# Patient Record
Sex: Male | Born: 1950 | Race: Black or African American | Hispanic: No | Marital: Married | Smoking: Never smoker
Health system: Southern US, Community
[De-identification: ages and names within clinical notes are randomized; demographics above are authoritative.]

## PROBLEM LIST (undated history)

## (undated) DIAGNOSIS — M79643 Pain in unspecified hand: Secondary | ICD-10-CM

## (undated) DIAGNOSIS — E785 Hyperlipidemia, unspecified: Secondary | ICD-10-CM

## (undated) DIAGNOSIS — R001 Bradycardia, unspecified: Secondary | ICD-10-CM

## (undated) DIAGNOSIS — J189 Pneumonia, unspecified organism: Secondary | ICD-10-CM

## (undated) DIAGNOSIS — E7800 Pure hypercholesterolemia, unspecified: Secondary | ICD-10-CM

## (undated) HISTORY — DX: Pain in unspecified hand: M79.643

## (undated) HISTORY — DX: Pure hypercholesterolemia, unspecified: E78.00

## (undated) HISTORY — PX: TONSILLECTOMY: SUR1361

## (undated) HISTORY — DX: Pneumonia, unspecified organism: J18.9

---

## 2008-01-12 ENCOUNTER — Emergency Department (HOSPITAL_COMMUNITY): Admission: EM | Admit: 2008-01-12 | Discharge: 2008-01-13 | Payer: Self-pay | Admitting: Emergency Medicine

## 2013-11-30 DIAGNOSIS — E7800 Pure hypercholesterolemia, unspecified: Secondary | ICD-10-CM | POA: Diagnosis not present

## 2013-11-30 DIAGNOSIS — Z1331 Encounter for screening for depression: Secondary | ICD-10-CM | POA: Diagnosis not present

## 2013-11-30 DIAGNOSIS — Z23 Encounter for immunization: Secondary | ICD-10-CM | POA: Diagnosis not present

## 2013-11-30 DIAGNOSIS — R7301 Impaired fasting glucose: Secondary | ICD-10-CM | POA: Diagnosis not present

## 2014-05-30 DIAGNOSIS — H524 Presbyopia: Secondary | ICD-10-CM | POA: Diagnosis not present

## 2014-05-30 DIAGNOSIS — H0259 Other disorders affecting eyelid function: Secondary | ICD-10-CM | POA: Diagnosis not present

## 2014-05-30 DIAGNOSIS — H52223 Regular astigmatism, bilateral: Secondary | ICD-10-CM | POA: Diagnosis not present

## 2014-05-30 DIAGNOSIS — Z8669 Personal history of other diseases of the nervous system and sense organs: Secondary | ICD-10-CM | POA: Diagnosis not present

## 2014-05-30 DIAGNOSIS — H25042 Posterior subcapsular polar age-related cataract, left eye: Secondary | ICD-10-CM | POA: Diagnosis not present

## 2014-05-30 DIAGNOSIS — H2513 Age-related nuclear cataract, bilateral: Secondary | ICD-10-CM | POA: Diagnosis not present

## 2014-05-30 DIAGNOSIS — H40023 Open angle with borderline findings, high risk, bilateral: Secondary | ICD-10-CM | POA: Diagnosis not present

## 2014-05-30 DIAGNOSIS — H25013 Cortical age-related cataract, bilateral: Secondary | ICD-10-CM | POA: Diagnosis not present

## 2014-05-30 DIAGNOSIS — H5203 Hypermetropia, bilateral: Secondary | ICD-10-CM | POA: Diagnosis not present

## 2014-05-30 DIAGNOSIS — H16223 Keratoconjunctivitis sicca, not specified as Sjogren's, bilateral: Secondary | ICD-10-CM | POA: Diagnosis not present

## 2014-06-28 DIAGNOSIS — H16223 Keratoconjunctivitis sicca, not specified as Sjogren's, bilateral: Secondary | ICD-10-CM | POA: Diagnosis not present

## 2014-06-28 DIAGNOSIS — H40023 Open angle with borderline findings, high risk, bilateral: Secondary | ICD-10-CM | POA: Diagnosis not present

## 2015-01-08 DIAGNOSIS — R7309 Other abnormal glucose: Secondary | ICD-10-CM | POA: Diagnosis not present

## 2015-01-08 DIAGNOSIS — L299 Pruritus, unspecified: Secondary | ICD-10-CM | POA: Diagnosis not present

## 2015-01-08 DIAGNOSIS — Z1389 Encounter for screening for other disorder: Secondary | ICD-10-CM | POA: Diagnosis not present

## 2015-01-08 DIAGNOSIS — Z23 Encounter for immunization: Secondary | ICD-10-CM | POA: Diagnosis not present

## 2015-01-08 DIAGNOSIS — E780 Pure hypercholesterolemia: Secondary | ICD-10-CM | POA: Diagnosis not present

## 2015-01-09 DIAGNOSIS — H5203 Hypermetropia, bilateral: Secondary | ICD-10-CM | POA: Diagnosis not present

## 2015-01-09 DIAGNOSIS — H16223 Keratoconjunctivitis sicca, not specified as Sjogren's, bilateral: Secondary | ICD-10-CM | POA: Diagnosis not present

## 2015-01-09 DIAGNOSIS — H25042 Posterior subcapsular polar age-related cataract, left eye: Secondary | ICD-10-CM | POA: Diagnosis not present

## 2015-01-09 DIAGNOSIS — H0259 Other disorders affecting eyelid function: Secondary | ICD-10-CM | POA: Diagnosis not present

## 2015-01-09 DIAGNOSIS — H25013 Cortical age-related cataract, bilateral: Secondary | ICD-10-CM | POA: Diagnosis not present

## 2015-01-09 DIAGNOSIS — H40023 Open angle with borderline findings, high risk, bilateral: Secondary | ICD-10-CM | POA: Diagnosis not present

## 2015-01-09 DIAGNOSIS — H52223 Regular astigmatism, bilateral: Secondary | ICD-10-CM | POA: Diagnosis not present

## 2015-01-09 DIAGNOSIS — H2513 Age-related nuclear cataract, bilateral: Secondary | ICD-10-CM | POA: Diagnosis not present

## 2015-01-09 DIAGNOSIS — H524 Presbyopia: Secondary | ICD-10-CM | POA: Diagnosis not present

## 2015-09-12 DIAGNOSIS — H16223 Keratoconjunctivitis sicca, not specified as Sjogren's, bilateral: Secondary | ICD-10-CM | POA: Diagnosis not present

## 2015-09-12 DIAGNOSIS — H40023 Open angle with borderline findings, high risk, bilateral: Secondary | ICD-10-CM | POA: Diagnosis not present

## 2015-09-12 DIAGNOSIS — H25013 Cortical age-related cataract, bilateral: Secondary | ICD-10-CM | POA: Diagnosis not present

## 2015-09-12 DIAGNOSIS — H524 Presbyopia: Secondary | ICD-10-CM | POA: Diagnosis not present

## 2015-09-12 DIAGNOSIS — H52223 Regular astigmatism, bilateral: Secondary | ICD-10-CM | POA: Diagnosis not present

## 2015-09-12 DIAGNOSIS — H25042 Posterior subcapsular polar age-related cataract, left eye: Secondary | ICD-10-CM | POA: Diagnosis not present

## 2015-09-12 DIAGNOSIS — H2513 Age-related nuclear cataract, bilateral: Secondary | ICD-10-CM | POA: Diagnosis not present

## 2016-01-08 DIAGNOSIS — Z125 Encounter for screening for malignant neoplasm of prostate: Secondary | ICD-10-CM | POA: Diagnosis not present

## 2016-01-08 DIAGNOSIS — R079 Chest pain, unspecified: Secondary | ICD-10-CM | POA: Diagnosis not present

## 2016-01-08 DIAGNOSIS — Z23 Encounter for immunization: Secondary | ICD-10-CM | POA: Diagnosis not present

## 2016-01-08 DIAGNOSIS — Z0000 Encounter for general adult medical examination without abnormal findings: Secondary | ICD-10-CM | POA: Diagnosis not present

## 2016-01-08 DIAGNOSIS — E7800 Pure hypercholesterolemia, unspecified: Secondary | ICD-10-CM | POA: Diagnosis not present

## 2016-01-08 DIAGNOSIS — R001 Bradycardia, unspecified: Secondary | ICD-10-CM | POA: Diagnosis not present

## 2016-01-08 DIAGNOSIS — Z1211 Encounter for screening for malignant neoplasm of colon: Secondary | ICD-10-CM | POA: Diagnosis not present

## 2016-01-08 DIAGNOSIS — L299 Pruritus, unspecified: Secondary | ICD-10-CM | POA: Diagnosis not present

## 2016-01-08 DIAGNOSIS — R7301 Impaired fasting glucose: Secondary | ICD-10-CM | POA: Diagnosis not present

## 2016-01-17 DIAGNOSIS — R001 Bradycardia, unspecified: Secondary | ICD-10-CM | POA: Diagnosis not present

## 2016-01-17 DIAGNOSIS — R079 Chest pain, unspecified: Secondary | ICD-10-CM | POA: Diagnosis not present

## 2016-01-17 DIAGNOSIS — E785 Hyperlipidemia, unspecified: Secondary | ICD-10-CM | POA: Diagnosis not present

## 2016-01-17 DIAGNOSIS — R0602 Shortness of breath: Secondary | ICD-10-CM | POA: Diagnosis not present

## 2016-01-17 DIAGNOSIS — R0789 Other chest pain: Secondary | ICD-10-CM | POA: Diagnosis not present

## 2016-01-27 ENCOUNTER — Emergency Department (HOSPITAL_BASED_OUTPATIENT_CLINIC_OR_DEPARTMENT_OTHER): Payer: Medicare Other

## 2016-01-27 ENCOUNTER — Emergency Department (HOSPITAL_BASED_OUTPATIENT_CLINIC_OR_DEPARTMENT_OTHER)
Admission: EM | Admit: 2016-01-27 | Discharge: 2016-01-27 | Disposition: A | Discharge status: Home / Self Care | Payer: Medicare Other | Attending: Emergency Medicine | Admitting: Emergency Medicine

## 2016-01-27 ENCOUNTER — Encounter (HOSPITAL_BASED_OUTPATIENT_CLINIC_OR_DEPARTMENT_OTHER): Payer: Self-pay | Admitting: Emergency Medicine

## 2016-01-27 DIAGNOSIS — Z791 Long term (current) use of non-steroidal anti-inflammatories (NSAID): Secondary | ICD-10-CM | POA: Insufficient documentation

## 2016-01-27 DIAGNOSIS — Z7982 Long term (current) use of aspirin: Secondary | ICD-10-CM | POA: Insufficient documentation

## 2016-01-27 DIAGNOSIS — M79674 Pain in right toe(s): Secondary | ICD-10-CM | POA: Diagnosis not present

## 2016-01-27 HISTORY — DX: Hyperlipidemia, unspecified: E78.5

## 2016-01-27 NOTE — Discharge Instructions (Signed)
Please read and follow all provided instructions.  Your diagnoses today include:  1. Great toe pain, right     Tests performed today include:  An x-ray of the affected area - does NOT show any broken bones  Vital signs. See below for your results today.   Medications prescribed:   None  Take any prescribed medications only as directed.  Home care instructions:   Follow any educational materials contained in this packet  Follow R.I.C.E. Protocol:  R - rest your injury   I  - use ice on injury without applying directly to skin  C - compress injury with bandage or splint  E - elevate the injury as much as possible  Follow-up instructions: Please follow-up with your primary care provider or the provided orthopedic physician (bone specialist) if you continue to have significant pain in 1 week. In this case you may have a more severe injury that requires further care.   Return instructions:   Please return if your toes or feet are numb or tingling, appear gray or blue, or you have severe pain (also elevate the leg and loosen splint or wrap if you were given one)  Please return to the Emergency Department if you experience worsening symptoms.   Please return if you have any other emergent concerns.  Additional Information:  Your vital signs today were: BP 149/83 (BP Location: Right Arm)    Pulse (!) 52    Temp 97.8 F (36.6 C) (Oral)    Ht 5' 10.5" (1.791 m)    Wt 83.5 kg    SpO2 100%    BMI 26.03 kg/m  If your blood pressure (BP) was elevated above 135/85 this visit, please have this repeated by your doctor within one month. --------------

## 2016-01-27 NOTE — ED Notes (Signed)
PA at bedside.

## 2016-01-27 NOTE — ED Triage Notes (Signed)
Patient c/o right great toe pain. Patient states the pain started on Thursday. Patient states he has tried epsom salts soaks and ibuprofen. Ibuprofen helps some. Patient is ambulatory. Patient denies being diabetic.

## 2016-01-27 NOTE — ED Notes (Signed)
Pt given d/c instructions as per chart. Verbalizes understanding. No questions. 

## 2016-01-27 NOTE — ED Provider Notes (Signed)
[facility]   CSN: [id number] Arrival date & time: 01/27/16  1457  By signing my name below, I, Linna DarnerRussell Turner, attest that this documentation has been prepared under the direction and in the presence of Wal-MartJosh Liebenthal Shellhammer, PA-C. Electronically Signed: Linna Darnerussell Turner, Scribe. 01/27/2016. 4:11 PM.  History   Chief Complaint Chief Complaint  Patient presents with  . Toe Pain    right, great    The history is provided by the patient. No language interpreter was used.     HPI Comments: Andrew Craig is a 65 y.o. male who presents to the Emergency Department complaining of sudden onset, constant, right big toe pain beginning 4 days ago. Pt reports he was working inside his home when his right big toe pain presented but he denies known injury or cause. He has soaked the toe in hot water and applied ice with no relief of his pain. Pt notes he has been ambulating slowly and with a limp due to right big toe pain. He denies h/o gout. Pt further denies right ankle pain, right knee pain, numbness, neuro deficits, or any other associated symptoms.  Past Medical History:  Diagnosis Date  . Hyperlipemia     There are no active problems to display for this patient.   Past Surgical History:  Procedure Laterality Date  . TONSILLECTOMY         Home Medications    Prior to Admission medications   Medication Sig Start Date End Date Taking? Authorizing Provider  aspirin 81 MG chewable tablet Chew by mouth daily.   Yes Historical Provider, MD  ibuprofen (ADVIL,MOTRIN) 200 MG tablet Take 400 mg by mouth every 6 (six) hours as needed.   Yes Historical Provider, MD    Family History History reviewed. No pertinent family history.  Social History Social History  Substance Use Topics  . Smoking status: Never Smoker  . Smokeless tobacco: Never Used  . Alcohol use Yes     Comment: occ     Allergies   Ibuprofen   Review of Systems Review of Systems  Constitutional:  Negative for activity change.  Musculoskeletal: Positive for arthralgias (right big toe) and gait problem. Negative for back pain, joint swelling and neck pain.  Skin: Negative for wound.  Neurological: Negative for weakness and numbness.       Negative for sensation loss.    Physical Exam Updated Vital Signs BP 149/83 (BP Location: Right Arm)   Pulse (!) 52   Temp 97.8 F (36.6 C) (Oral)   Ht 5' 10.5" (1.791 m)   Wt 184 lb (83.5 kg)   SpO2 100%   BMI 26.03 kg/m   Physical Exam  Constitutional: He is oriented to person, place, and time. He appears well-developed and well-nourished. No distress.  HENT:  Head: Normocephalic and atraumatic.  Eyes: Conjunctivae and EOM are normal.  Neck: Normal range of motion. Neck supple. No tracheal deviation present.  Cardiovascular: Normal rate and normal pulses.  Exam reveals no decreased pulses.   Pulmonary/Chest: Effort normal. No respiratory distress.  Musculoskeletal: He exhibits tenderness. He exhibits no edema.       Right knee: Normal.       Right ankle: Normal.       Right lower leg: Normal.       Right foot: There is decreased range of motion, tenderness and bony tenderness.       Feet:  Neurological: He is alert and oriented to person, place, and time. No  sensory deficit.  Motor, sensation, and vascular distal to the injury is fully intact.   Skin: Skin is warm and dry.  Psychiatric: He has a normal mood and affect. His behavior is normal.  Nursing note and vitals reviewed.   ED Treatments / Results   Radiology Dg Toe Great Right  Result Date: 01/27/2016 CLINICAL DATA:  Right great toe pain for several days. EXAM: RIGHT GREAT TOE COMPARISON:  None. FINDINGS: There is no evidence of fracture or dislocation. Mild degenerative disc disease is noted at the first metatarsophalangeal joint. Soft tissues are unremarkable. 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 No acute abnormality seen in  the right first toe. Electronically Signed   By: Lupita Raider, M.D.   On: 01/27/2016 15:45    Procedures Procedures (including critical care time)  DIAGNOSTIC STUDIES: Oxygen Saturation is 100% on RA, normal by my interpretation.    COORDINATION OF CARE: 4:15 PM Will order DG Right Great Toe. Discussed treatment plan with pt at bedside and pt agreed to plan.  Medications Ordered in ED Medications - No data to display   Initial Impression / Assessment and Plan / ED Course  I have reviewed the triage vital signs and the nursing notes.  Pertinent labs & imaging results that were available during my care of the patient were reviewed by me and considered in my medical decision making (see chart for details).  Clinical Course   Patient seen and examined. Informed of neg x-ray results. Will treat Conservatively. Offered crutches, postop shoe, patient declines. Encouraged her to follow-up if not improved in one week. Discussed rice protocol, NSAIDs.  Vital signs reviewed and are as follows: BP 149/83 (BP Location: Right Arm)   Pulse (!) 52   Temp 97.8 F (36.6 C) (Oral)   Ht 5' 10.5" (1.791 m)   Wt 83.5 kg   SpO2 100%   BMI 26.03 kg/m    I personally performed the services described in this documentation, which was scribed in my presence. The recorded information has been reviewed and is accurate.   Final Clinical Impressions(s) / ED Diagnoses   Final diagnoses:  Great toe pain, right   Patient with great right toe pain, likely hyperextension injury. X-rays are negative. Does not appear to be gouty arthritis. No swelling, tenderness or warmth to suggest inflammatory arthritis or infection. Conservative measures indicated.  New Prescriptions New Prescriptions   No medications on file     Renne Crigler, PA-C 01/27/16 1701    Rolan Bucco, MD 01/27/16 [phone]

## 2016-02-11 DIAGNOSIS — R0609 Other forms of dyspnea: Secondary | ICD-10-CM | POA: Diagnosis not present

## 2016-02-15 DIAGNOSIS — I498 Other specified cardiac arrhythmias: Secondary | ICD-10-CM | POA: Diagnosis not present

## 2016-04-01 DIAGNOSIS — H2513 Age-related nuclear cataract, bilateral: Secondary | ICD-10-CM | POA: Diagnosis not present

## 2016-04-01 DIAGNOSIS — H524 Presbyopia: Secondary | ICD-10-CM | POA: Diagnosis not present

## 2016-04-01 DIAGNOSIS — H16223 Keratoconjunctivitis sicca, not specified as Sjogren's, bilateral: Secondary | ICD-10-CM | POA: Diagnosis not present

## 2016-04-01 DIAGNOSIS — H5203 Hypermetropia, bilateral: Secondary | ICD-10-CM | POA: Diagnosis not present

## 2016-04-01 DIAGNOSIS — H25042 Posterior subcapsular polar age-related cataract, left eye: Secondary | ICD-10-CM | POA: Diagnosis not present

## 2016-04-01 DIAGNOSIS — H52223 Regular astigmatism, bilateral: Secondary | ICD-10-CM | POA: Diagnosis not present

## 2016-04-01 DIAGNOSIS — H40023 Open angle with borderline findings, high risk, bilateral: Secondary | ICD-10-CM | POA: Diagnosis not present

## 2016-04-01 DIAGNOSIS — H25013 Cortical age-related cataract, bilateral: Secondary | ICD-10-CM | POA: Diagnosis not present

## 2016-06-19 DIAGNOSIS — Z1211 Encounter for screening for malignant neoplasm of colon: Secondary | ICD-10-CM | POA: Diagnosis not present

## 2016-07-02 DIAGNOSIS — H40023 Open angle with borderline findings, high risk, bilateral: Secondary | ICD-10-CM | POA: Diagnosis not present

## 2016-07-16 DIAGNOSIS — H40023 Open angle with borderline findings, high risk, bilateral: Secondary | ICD-10-CM | POA: Diagnosis not present

## 2016-07-16 DIAGNOSIS — H2513 Age-related nuclear cataract, bilateral: Secondary | ICD-10-CM | POA: Diagnosis not present

## 2017-01-29 DIAGNOSIS — H2513 Age-related nuclear cataract, bilateral: Secondary | ICD-10-CM | POA: Diagnosis not present

## 2017-01-29 DIAGNOSIS — H40023 Open angle with borderline findings, high risk, bilateral: Secondary | ICD-10-CM | POA: Diagnosis not present

## 2017-04-24 DIAGNOSIS — E7800 Pure hypercholesterolemia, unspecified: Secondary | ICD-10-CM | POA: Diagnosis not present

## 2017-04-24 DIAGNOSIS — Z0000 Encounter for general adult medical examination without abnormal findings: Secondary | ICD-10-CM | POA: Diagnosis not present

## 2017-04-24 DIAGNOSIS — Z125 Encounter for screening for malignant neoplasm of prostate: Secondary | ICD-10-CM | POA: Diagnosis not present

## 2017-04-24 DIAGNOSIS — Z23 Encounter for immunization: Secondary | ICD-10-CM | POA: Diagnosis not present

## 2017-04-24 DIAGNOSIS — Z1159 Encounter for screening for other viral diseases: Secondary | ICD-10-CM | POA: Diagnosis not present

## 2017-04-24 DIAGNOSIS — Z1389 Encounter for screening for other disorder: Secondary | ICD-10-CM | POA: Diagnosis not present

## 2017-04-24 DIAGNOSIS — R7303 Prediabetes: Secondary | ICD-10-CM | POA: Diagnosis not present

## 2018-01-19 ENCOUNTER — Other Ambulatory Visit: Payer: Self-pay

## 2018-01-19 ENCOUNTER — Encounter (HOSPITAL_BASED_OUTPATIENT_CLINIC_OR_DEPARTMENT_OTHER): Payer: Self-pay | Admitting: *Deleted

## 2018-01-19 ENCOUNTER — Emergency Department (HOSPITAL_BASED_OUTPATIENT_CLINIC_OR_DEPARTMENT_OTHER): Payer: Medicare Other

## 2018-01-19 ENCOUNTER — Emergency Department (HOSPITAL_BASED_OUTPATIENT_CLINIC_OR_DEPARTMENT_OTHER)
Admission: EM | Admit: 2018-01-19 | Discharge: 2018-01-20 | Disposition: A | Discharge status: Home / Self Care | Payer: Medicare Other | Attending: Emergency Medicine | Admitting: Emergency Medicine

## 2018-01-19 DIAGNOSIS — Y939 Activity, unspecified: Secondary | ICD-10-CM | POA: Diagnosis not present

## 2018-01-19 DIAGNOSIS — Z23 Encounter for immunization: Secondary | ICD-10-CM | POA: Diagnosis not present

## 2018-01-19 DIAGNOSIS — S6710XA Crushing injury of unspecified finger(s), initial encounter: Secondary | ICD-10-CM

## 2018-01-19 DIAGNOSIS — W230XXA Caught, crushed, jammed, or pinched between moving objects, initial encounter: Secondary | ICD-10-CM | POA: Diagnosis not present

## 2018-01-19 DIAGNOSIS — Y999 Unspecified external cause status: Secondary | ICD-10-CM | POA: Insufficient documentation

## 2018-01-19 DIAGNOSIS — S61319A Laceration without foreign body of unspecified finger with damage to nail, initial encounter: Secondary | ICD-10-CM

## 2018-01-19 DIAGNOSIS — S61312A Laceration without foreign body of right middle finger with damage to nail, initial encounter: Secondary | ICD-10-CM | POA: Insufficient documentation

## 2018-01-19 DIAGNOSIS — Y9289 Other specified places as the place of occurrence of the external cause: Secondary | ICD-10-CM | POA: Diagnosis not present

## 2018-01-19 DIAGNOSIS — M79671 Pain in right foot: Secondary | ICD-10-CM | POA: Diagnosis not present

## 2018-01-19 DIAGNOSIS — Z7982 Long term (current) use of aspirin: Secondary | ICD-10-CM | POA: Diagnosis not present

## 2018-01-19 DIAGNOSIS — S67192A Crushing injury of right middle finger, initial encounter: Secondary | ICD-10-CM | POA: Diagnosis not present

## 2018-01-19 DIAGNOSIS — Z79899 Other long term (current) drug therapy: Secondary | ICD-10-CM | POA: Diagnosis not present

## 2018-01-19 MED ORDER — LIDOCAINE HCL (PF) 1 % IJ SOLN
10.0000 mL | Freq: Once | INTRAMUSCULAR | Status: AC
  Administered 2018-01-20: 5 mL
  Filled 2018-01-19: qty 10

## 2018-01-19 NOTE — ED Notes (Signed)
Pt. Has HR of 38 with no distress noted.   Pt. Reports he always has a HR of 37-40

## 2018-01-19 NOTE — ED Triage Notes (Signed)
He was putting trash in a dumpster tonight and mashed his right middle finger when he closed the lid on his hand. Bleeding controlled.

## 2018-01-20 DIAGNOSIS — S61312A Laceration without foreign body of right middle finger with damage to nail, initial encounter: Secondary | ICD-10-CM | POA: Diagnosis not present

## 2018-01-20 MED ORDER — CEPHALEXIN 250 MG PO CAPS
1000.0000 mg | ORAL_CAPSULE | Freq: Once | ORAL | Status: AC
  Administered 2018-01-20: 1000 mg via ORAL
  Filled 2018-01-20: qty 4

## 2018-01-20 MED ORDER — CEPHALEXIN 500 MG PO CAPS: 1000.0000 mg | ORAL_CAPSULE | Freq: Two times a day (BID) | ORAL | 0 refills | Status: DC

## 2018-01-20 MED ORDER — ACETAMINOPHEN 500 MG PO TABS
1000.0000 mg | ORAL_TABLET | Freq: Once | ORAL | Status: AC
  Administered 2018-01-20: 1000 mg via ORAL
  Filled 2018-01-20: qty 2

## 2018-01-20 MED ORDER — TETANUS-DIPHTH-ACELL PERTUSSIS 5-2.5-18.5 LF-MCG/0.5 IM SUSP
0.5000 mL | Freq: Once | INTRAMUSCULAR | Status: AC
  Administered 2018-01-20: 0.5 mL via INTRAMUSCULAR
  Filled 2018-01-20: qty 0.5

## 2018-01-20 MED ORDER — HYDROCODONE-ACETAMINOPHEN 5-325 MG PO TABS: 1.0000 | ORAL_TABLET | ORAL | 0 refills | Status: DC | PRN

## 2018-01-20 NOTE — ED Provider Notes (Signed)
[facility] EMERGENCY DEPARTMENT Provider Note   CSN: [id number] Arrival date & time: 01/19/18  2121     History   Chief Complaint Chief Complaint  Patient presents with  . Finger Injury    HPI Andrew Craig is a 67 y.o. male.  HPI Patient got his right middle finger caught under a dumpster lid.  He tried applying dressings but it did continue to ooze blood and the nail is split.  He reports it is painful.  No other associated injury. Past Medical History:  Diagnosis Date  . Hyperlipemia     There are no active problems to display for this patient.   Past Surgical History:  Procedure Laterality Date  . TONSILLECTOMY          Home Medications    Prior to Admission medications   Medication Sig Start Date End Date Taking? Authorizing Provider  aspirin 81 MG chewable tablet Chew by mouth daily.   Yes [provider]  Atorvastatin Calcium (LIPITOR PO) Take by mouth.   Yes [provider]  cephALEXin (KEFLEX) 500 MG capsule Take 2 capsules (1,000 mg total) by mouth 2 (two) times daily. 01/20/18   Arby Barrette, MD  HYDROcodone-acetaminophen (NORCO/VICODIN) 5-325 MG tablet Take 1-2 tablets by mouth every 4 (four) hours as needed for moderate pain or severe pain. 01/20/18   Arby Barrette, MD  ibuprofen (ADVIL,MOTRIN) 200 MG tablet Take 400 mg by mouth every 6 (six) hours as needed.    [provider]    Family History No family history on file.  Social History Social History   Tobacco Use  . Smoking status: Never Smoker  . Smokeless tobacco: Never Used  Substance Use Topics  . Alcohol use: Yes    Comment: occ  . Drug use: No     Allergies   Ibuprofen   Review of Systems Review of Systems Constitutional: No recent fever chills or general illness  Physical Exam Updated Vital Signs BP (!) 149/73 (BP Location: Left Arm)   Pulse (!) 34   Temp 98.2 F (36.8 C) (Oral)   Resp 18   Ht 5\' 10"  (1.778 m)   Wt 83 kg    SpO2 100%   BMI 26.26 kg/m   Physical Exam  Constitutional: He is oriented to person, place, and time. He appears well-developed and well-nourished. No distress.  Eyes: EOM are normal.  Pulmonary/Chest: Effort normal.  Musculoskeletal:  Right middle finger has a nailbed laceration with a split through the middle of the nail.  Is mobility with movement of the distal portion.  The palmar surface of the finger is completely intact without laceration.  No injury proximal to the nailbed.  Once the portion of nail elevated and removed, there is a predominantly horizontal linear laceration approximately 1 cm with some stellate portion at the lateral corner.  This does all reapproximate with positioning.  Neurological: He is alert and oriented to person, place, and time. He exhibits normal muscle tone. Coordination normal.     ED Treatments / Results  Labs (all labs ordered are listed, but only abnormal results are displayed) Labs Reviewed - No data to display  EKG None  Radiology Dg Finger Middle Right  Result Date: 01/19/2018 CLINICAL DATA:  Crush injury to the right third finger EXAM: RIGHT MIDDLE FINGER 2+V COMPARISON:  None. FINDINGS: No fracture or dislocation. No suspicious focal osseous lesion. Minimal DIP joint osteoarthritis. No radiopaque foreign body. Mild soft tissue swelling in the distal third  finger. IMPRESSION: No fracture or malalignment.  Minimal DIP joint osteoarthritis. Electronically Signed   By: Delbert Phenix M.D.   On: 01/19/2018 22:44    Procedures .Marland KitchenLaceration Repair Date/Time: 01/20/2018 12:46 AM Performed by: Arby Barrette, MD Authorized by: Arby Barrette, MD   Consent:    Consent obtained:  Verbal   Consent given by:  Patient   Risks discussed:  Infection, need for additional repair, pain, poor cosmetic result, poor wound healing and nerve damage   Alternatives discussed:  Referral Anesthesia (see MAR for exact dosages):    Anesthesia method:  Nerve  block   Block needle gauge:  25 G   Block anesthetic:  Lidocaine 1% w/o epi   Block technique:  Digital block   Block injection procedure:  Anatomic landmarks identified, anatomic landmarks palpated, negative aspiration for blood, introduced needle and incremental injection   Block outcome:  Anesthesia achieved Laceration details:    Location:  Finger   Finger location:  R long finger   Length (cm):  1   Depth (mm):  5 Repair type:    Repair type:  Complex Pre-procedure details:    Preparation:  Patient was prepped and draped in usual sterile fashion Exploration:    Limited defect created (wound extended): yes     Wound exploration: entire depth of wound probed and visualized     Wound extent: areolar tissue violated     Contaminated: no   Treatment:    Area cleansed with:  Saline and Shur-Clens   Amount of cleaning:  Standard   Irrigation solution:  Sterile water   Irrigation method:  Syringe   Debridement:  None   Undermining:  Extensive   Scar revision: no   Skin repair:    Repair method:  Sutures   Suture size:  4-0   Suture technique:  Simple interrupted   Number of sutures:  5 Approximation:    Approximation:  Close Post-procedure details:    Dressing:  Non-adherent dressing and bulky dressing   Patient tolerance of procedure:  Tolerated well, no immediate complications Comments:     Patient's nail had a linear split in the horizontal direction.  The distal portion of the nail was elevated and removed.  The bed was extensively cleaned.  5 interrupted sutures placed for stabilization of nail bed.  Proximal portion of nail remained nicely seated within the eponychial fold.  Trace and ointment applied, Xeroform applied and a roll gauze dressing applied.   (including critical care time)  Medications Ordered in ED Medications  lidocaine (PF) (XYLOCAINE) 1 % injection 10 mL (has no administration in time range)  cephALEXin (KEFLEX) capsule 1,000 mg (has no administration  in time range)  acetaminophen (TYLENOL) tablet 1,000 mg (has no administration in time range)  Tdap (BOOSTRIX) injection 0.5 mL (has no administration in time range)     Initial Impression / Assessment and Plan / ED Course  I have reviewed the triage vital signs and the nursing notes.  Pertinent labs & imaging results that were available during my care of the patient were reviewed by me and considered in my medical decision making (see chart for details).    Patient with isolated crush injury to his fingertip.  Repaired as outlined.  Tolerated well.  Tetanus updated.  Patient discharged with Keflex and Vicodin.  Final Clinical Impressions(s) / ED Diagnoses   Final diagnoses:  Crushing injury of finger, initial encounter  Laceration of nail bed of finger, initial encounter  ED Discharge Orders         Ordered    cephALEXin (KEFLEX) 500 MG capsule  2 times daily     01/20/18 0038    HYDROcodone-acetaminophen (NORCO/VICODIN) 5-325 MG tablet  Every 4 hours PRN     01/20/18 0038           Arby Barrette, MD 01/20/18 0050

## 2018-01-20 NOTE — Discharge Instructions (Addendum)
1.  Leave your dressing in place for the next 3 to 4 days until you can be seen in follow-up.  You may remove your dressing if you feel it is too tight or pain is worsening significantly and there is drainage that suggests infection. 2.  Return to the emergency department if you have increased swelling, pain, fever or other concerning symptoms. 3.  If you do not get a follow-up appointment scheduled within the next 4 days, you may remove your dressing rinse the wound with a very mild soap and water, gently pat dry, reapply antibiotic ointment and dressing.

## 2018-01-26 DIAGNOSIS — M79641 Pain in right hand: Secondary | ICD-10-CM | POA: Diagnosis not present

## 2018-02-16 DIAGNOSIS — R7303 Prediabetes: Secondary | ICD-10-CM | POA: Diagnosis not present

## 2018-02-16 DIAGNOSIS — Z1389 Encounter for screening for other disorder: Secondary | ICD-10-CM | POA: Diagnosis not present

## 2018-02-16 DIAGNOSIS — E7800 Pure hypercholesterolemia, unspecified: Secondary | ICD-10-CM | POA: Diagnosis not present

## 2018-02-16 DIAGNOSIS — R200 Anesthesia of skin: Secondary | ICD-10-CM | POA: Diagnosis not present

## 2018-02-16 DIAGNOSIS — Z23 Encounter for immunization: Secondary | ICD-10-CM | POA: Diagnosis not present

## 2018-05-03 DIAGNOSIS — Z0000 Encounter for general adult medical examination without abnormal findings: Secondary | ICD-10-CM | POA: Diagnosis not present

## 2018-05-03 DIAGNOSIS — Z1389 Encounter for screening for other disorder: Secondary | ICD-10-CM | POA: Diagnosis not present

## 2018-05-03 DIAGNOSIS — E7800 Pure hypercholesterolemia, unspecified: Secondary | ICD-10-CM | POA: Diagnosis not present

## 2018-05-03 DIAGNOSIS — Z125 Encounter for screening for malignant neoplasm of prostate: Secondary | ICD-10-CM | POA: Diagnosis not present

## 2018-05-03 DIAGNOSIS — E1169 Type 2 diabetes mellitus with other specified complication: Secondary | ICD-10-CM | POA: Diagnosis not present

## 2018-07-07 DIAGNOSIS — Z1211 Encounter for screening for malignant neoplasm of colon: Secondary | ICD-10-CM | POA: Diagnosis not present

## 2018-07-07 DIAGNOSIS — K648 Other hemorrhoids: Secondary | ICD-10-CM | POA: Diagnosis not present

## 2018-12-03 DIAGNOSIS — M79652 Pain in left thigh: Secondary | ICD-10-CM | POA: Diagnosis not present

## 2018-12-03 DIAGNOSIS — I82812 Embolism and thrombosis of superficial veins of left lower extremities: Secondary | ICD-10-CM | POA: Diagnosis not present

## 2019-05-10 DIAGNOSIS — J069 Acute upper respiratory infection, unspecified: Secondary | ICD-10-CM | POA: Diagnosis not present

## 2019-05-10 DIAGNOSIS — E7800 Pure hypercholesterolemia, unspecified: Secondary | ICD-10-CM | POA: Diagnosis not present

## 2019-05-10 DIAGNOSIS — Z0000 Encounter for general adult medical examination without abnormal findings: Secondary | ICD-10-CM | POA: Diagnosis not present

## 2019-05-10 DIAGNOSIS — Z1389 Encounter for screening for other disorder: Secondary | ICD-10-CM | POA: Diagnosis not present

## 2019-05-10 DIAGNOSIS — Z125 Encounter for screening for malignant neoplasm of prostate: Secondary | ICD-10-CM | POA: Diagnosis not present

## 2019-05-10 DIAGNOSIS — E1169 Type 2 diabetes mellitus with other specified complication: Secondary | ICD-10-CM | POA: Diagnosis not present

## 2019-05-31 DIAGNOSIS — E7800 Pure hypercholesterolemia, unspecified: Secondary | ICD-10-CM | POA: Diagnosis not present

## 2019-05-31 DIAGNOSIS — E1169 Type 2 diabetes mellitus with other specified complication: Secondary | ICD-10-CM | POA: Diagnosis not present

## 2019-05-31 DIAGNOSIS — Z125 Encounter for screening for malignant neoplasm of prostate: Secondary | ICD-10-CM | POA: Diagnosis not present

## 2019-06-25 ENCOUNTER — Ambulatory Visit: Payer: Medicare Other | Attending: Internal Medicine

## 2019-06-25 DIAGNOSIS — Z23 Encounter for immunization: Secondary | ICD-10-CM

## 2019-06-25 NOTE — Progress Notes (Signed)
   Covid-19 Vaccination Clinic  Name:  Andrew Craig    MRN: [id number] DOB: 1950/10/03  06/25/2019  [name] was observed post Covid-19 immunization for 15 minutes without incidence. He was provided with Vaccine Information Sheet and instruction to access the V-Safe system.   [name] was instructed to call 911 with any severe reactions post vaccine: Marland Kitchen Difficulty breathing  . Swelling of your face and throat  . A fast heartbeat  . A bad rash all over your body  . Dizziness and weakness    Immunizations Administered    Name Date Dose VIS Date Route   Pfizer COVID-19 Vaccine 06/25/2019 11:21 AM 0.3 mL 04/08/2019 Intramuscular   Manufacturer: [employer], [employer]   Lot: [id number]   NDC: [address]-6

## 2019-07-20 ENCOUNTER — Ambulatory Visit: Payer: Medicare Other | Attending: Internal Medicine

## 2019-07-20 DIAGNOSIS — Z23 Encounter for immunization: Secondary | ICD-10-CM

## 2019-07-20 NOTE — Progress Notes (Signed)
   Covid-19 Vaccination Clinic  Name:  Andrew Craig    MRN: [id number] DOB: Dec 09, 1950  07/20/2019  [name] was observed post Covid-19 immunization for 15 minutes without incident. He was provided with Vaccine Information Sheet and instruction to access the V-Safe system.   [name] was instructed to call 911 with any severe reactions post vaccine: Marland Kitchen Difficulty breathing  . Swelling of face and throat  . A fast heartbeat  . A bad rash all over body  . Dizziness and weakness   Immunizations Administered    Name Date Dose VIS Date Route   Pfizer COVID-19 Vaccine 07/20/2019 11:59 AM 0.3 mL 04/08/2019 Intramuscular   Manufacturer: [employer], [employer]   Lot: [id number]   NDC: [address]-1

## 2019-12-29 ENCOUNTER — Other Ambulatory Visit: Payer: Self-pay

## 2019-12-29 ENCOUNTER — Emergency Department (HOSPITAL_BASED_OUTPATIENT_CLINIC_OR_DEPARTMENT_OTHER): Payer: Medicare Other

## 2019-12-29 ENCOUNTER — Encounter (HOSPITAL_BASED_OUTPATIENT_CLINIC_OR_DEPARTMENT_OTHER): Payer: Self-pay | Admitting: *Deleted

## 2019-12-29 ENCOUNTER — Emergency Department (HOSPITAL_BASED_OUTPATIENT_CLINIC_OR_DEPARTMENT_OTHER)
Admission: EM | Admit: 2019-12-29 | Discharge: 2019-12-29 | Disposition: A | Discharge status: Home / Self Care | Payer: Medicare Other | Attending: Emergency Medicine | Admitting: Emergency Medicine

## 2019-12-29 DIAGNOSIS — R509 Fever, unspecified: Secondary | ICD-10-CM | POA: Diagnosis not present

## 2019-12-29 DIAGNOSIS — Z20822 Contact with and (suspected) exposure to covid-19: Secondary | ICD-10-CM | POA: Insufficient documentation

## 2019-12-29 DIAGNOSIS — R531 Weakness: Secondary | ICD-10-CM

## 2019-12-29 DIAGNOSIS — Z7982 Long term (current) use of aspirin: Secondary | ICD-10-CM | POA: Insufficient documentation

## 2019-12-29 DIAGNOSIS — R05 Cough: Secondary | ICD-10-CM | POA: Diagnosis not present

## 2019-12-29 DIAGNOSIS — Z79899 Other long term (current) drug therapy: Secondary | ICD-10-CM | POA: Diagnosis not present

## 2019-12-29 DIAGNOSIS — J189 Pneumonia, unspecified organism: Secondary | ICD-10-CM | POA: Diagnosis not present

## 2019-12-29 LAB — CBC WITH DIFFERENTIAL/PLATELET
Abs Immature Granulocytes: 0.02 10*3/uL (ref 0.00–0.07)
Basophils Absolute: 0 10*3/uL (ref 0.0–0.1)
Basophils Relative: 1 %
Eosinophils Absolute: 0.2 10*3/uL (ref 0.0–0.5)
Eosinophils Relative: 3 %
HCT: 42.8 % (ref 39.0–52.0)
Hemoglobin: 14.6 g/dL (ref 13.0–17.0)
Immature Granulocytes: 0 %
Lymphocytes Relative: 19 %
Lymphs Abs: 1.2 10*3/uL (ref 0.7–4.0)
MCH: 30.9 pg (ref 26.0–34.0)
MCHC: 34.1 g/dL (ref 30.0–36.0)
MCV: 90.7 fL (ref 80.0–100.0)
Monocytes Absolute: 0.8 10*3/uL (ref 0.1–1.0)
Monocytes Relative: 12 %
Neutro Abs: 4.1 10*3/uL (ref 1.7–7.7)
Neutrophils Relative %: 65 %
Platelets: 193 10*3/uL (ref 150–400)
RBC: 4.72 MIL/uL (ref 4.22–5.81)
RDW: 12.9 % (ref 11.5–15.5)
WBC: 6.2 10*3/uL (ref 4.0–10.5)
nRBC: 0 % (ref 0.0–0.2)

## 2019-12-29 LAB — URINALYSIS, MICROSCOPIC (REFLEX): WBC, UA: NONE SEEN WBC/hpf (ref 0–5)

## 2019-12-29 LAB — COMPREHENSIVE METABOLIC PANEL
ALT: 31 U/L (ref 0–44)
AST: 27 U/L (ref 15–41)
Albumin: 3.3 g/dL — ABNORMAL LOW (ref 3.5–5.0)
Alkaline Phosphatase: 55 U/L (ref 38–126)
Anion gap: 10 (ref 5–15)
BUN: 15 mg/dL (ref 8–23)
CO2: 22 mmol/L (ref 22–32)
Calcium: 8.5 mg/dL — ABNORMAL LOW (ref 8.9–10.3)
Chloride: 97 mmol/L — ABNORMAL LOW (ref 98–111)
Creatinine, Ser: 1.54 mg/dL — ABNORMAL HIGH (ref 0.61–1.24)
GFR calc Af Amer: 52 mL/min — ABNORMAL LOW (ref >60)
GFR calc non Af Amer: 45 mL/min — ABNORMAL LOW (ref >60)
Glucose, Bld: 128 mg/dL — ABNORMAL HIGH (ref 70–99)
Potassium: 4 mmol/L (ref 3.5–5.1)
Sodium: 129 mmol/L — ABNORMAL LOW (ref 135–145)
Total Bilirubin: 0.4 mg/dL (ref 0.3–1.2)
Total Protein: 7.1 g/dL (ref 6.5–8.1)

## 2019-12-29 LAB — SARS CORONAVIRUS 2 BY RT PCR (HOSPITAL ORDER, PERFORMED IN ~~LOC~~ HOSPITAL LAB)
SARS Coronavirus 2: NEGATIVE
SARS Coronavirus 2: NEGATIVE

## 2019-12-29 LAB — URINALYSIS, ROUTINE W REFLEX MICROSCOPIC
Bilirubin Urine: NEGATIVE
Glucose, UA: NEGATIVE mg/dL
Ketones, ur: NEGATIVE mg/dL
Leukocytes,Ua: NEGATIVE
Nitrite: NEGATIVE
Protein, ur: NEGATIVE mg/dL
Specific Gravity, Urine: 1.005 (ref 1.005–1.030)
pH: 6 (ref 5.0–8.0)

## 2019-12-29 LAB — CBG MONITORING, ED: Glucose-Capillary: 119 mg/dL — ABNORMAL HIGH (ref 70–99)

## 2019-12-29 MED ORDER — SODIUM CHLORIDE 0.9 % IV BOLUS
1000.0000 mL | Freq: Once | INTRAVENOUS | Status: AC
  Administered 2019-12-29: 1000 mL via INTRAVENOUS

## 2019-12-29 MED ORDER — ACETAMINOPHEN 500 MG PO TABS
1000.0000 mg | ORAL_TABLET | Freq: Once | ORAL | Status: AC
  Administered 2019-12-29: 1000 mg via ORAL
  Filled 2019-12-29: qty 2

## 2019-12-29 MED ORDER — DOXYCYCLINE HYCLATE 100 MG PO CAPS: 100.0000 mg | ORAL_CAPSULE | Freq: Two times a day (BID) | ORAL | 0 refills | Status: AC

## 2019-12-29 MED ORDER — SODIUM CHLORIDE 0.9 % IV SOLN
500.0000 mg | Freq: Once | INTRAVENOUS | Status: AC
  Administered 2019-12-29: 500 mg via INTRAVENOUS
  Filled 2019-12-29: qty 500

## 2019-12-29 MED ORDER — SODIUM CHLORIDE 0.9 % IV SOLN
1.0000 g | Freq: Once | INTRAVENOUS | Status: AC
  Administered 2019-12-29: 1 g via INTRAVENOUS
  Filled 2019-12-29: qty 10

## 2019-12-29 NOTE — ED Provider Notes (Signed)
[facility] EMERGENCY DEPARTMENT Provider Note   CSN: [id number] Arrival date & time: 12/29/19  1314     History Chief Complaint  Patient presents with  . Fever  . Cough    Andrew Craig is a 69 y.o. male.  HPI 69 year old male presents feeling generally weak.  He states he woke up feeling this way.  He did feel little weak and not great yesterday but much worse today.  He states he urinated on himself this morning which is very abnormal.  He has not had any dysuria and has been urinating normal ever since.  He denies any back pain.  He did not know of a fever but was found to be febrile in triage.  He had a little bit of a cough and runny nose but no headache or body aches.  No chest pain or shortness of breath.   Past Medical History:  Diagnosis Date  . Hyperlipemia     There are no problems to display for this patient.   Past Surgical History:  Procedure Laterality Date  . TONSILLECTOMY         No family history on file.  Social History   Tobacco Use  . Smoking status: Never Smoker  . Smokeless tobacco: Never Used  Substance Use Topics  . Alcohol use: Yes    Comment: occ  . Drug use: No    Home Medications Prior to Admission medications   Medication Sig Start Date End Date Taking? Authorizing Provider  aspirin 81 MG chewable tablet Chew by mouth daily.   Yes [provider]  Atorvastatin Calcium (LIPITOR PO) Take by mouth.   Yes [provider]  cephALEXin (KEFLEX) 500 MG capsule Take 2 capsules (1,000 mg total) by mouth 2 (two) times daily. 01/20/18   Arby Barrette, MD  HYDROcodone-acetaminophen (NORCO/VICODIN) 5-325 MG tablet Take 1-2 tablets by mouth every 4 (four) hours as needed for moderate pain or severe pain. 01/20/18   Arby Barrette, MD  ibuprofen (ADVIL,MOTRIN) 200 MG tablet Take 400 mg by mouth every 6 (six) hours as needed.    [provider]    Allergies    Ibuprofen  Review of Systems   Review of  Systems  Constitutional: Negative for fever.  HENT: Positive for rhinorrhea. Negative for sore throat.   Respiratory: Positive for cough. Negative for shortness of breath.   Cardiovascular: Negative for chest pain.  Gastrointestinal: Negative for abdominal pain and vomiting.  Genitourinary: Negative for dysuria.  Musculoskeletal: Negative for back pain.  Neurological: Positive for weakness. Negative for headaches.  All other systems reviewed and are negative.   Physical Exam Updated Vital Signs BP 129/67   Pulse (!) 47   Temp 99 F (37.2 C) (Oral)   Resp 18   Ht 5' 10.5" (1.791 m)   Wt 81.2 kg   SpO2 98%   BMI 25.32 kg/m   Physical Exam Vitals and nursing note reviewed.  Constitutional:      General: He is not in acute distress.    Appearance: He is well-developed. He is not ill-appearing or diaphoretic.  HENT:     Head: Normocephalic and atraumatic.     Right Ear: External ear normal.     Left Ear: External ear normal.     Nose: Nose normal.  Eyes:     General:        Right eye: No discharge.        Left eye: No discharge.  Cardiovascular:  Rate and Rhythm: Normal rate and regular rhythm.     Heart sounds: Normal heart sounds.  Pulmonary:     Effort: Pulmonary effort is normal.     Breath sounds: Normal breath sounds.  Abdominal:     General: There is no distension.     Palpations: Abdomen is soft.     Tenderness: There is no abdominal tenderness.  Musculoskeletal:     Cervical back: Neck supple.  Skin:    General: Skin is warm and dry.  Neurological:     Mental Status: He is alert and oriented to person, place, and time.     Comments: CN 3-12 grossly intact. 5/5 strength in all 4 extremities. Grossly normal sensation. Normal finger to nose.   Psychiatric:        Mood and Affect: Mood is not anxious.     ED Results / Procedures / Treatments   Labs (all labs ordered are listed, but only abnormal results are displayed) Labs Reviewed  SARS CORONAVIRUS  2 BY RT PCR (HOSPITAL ORDER, PERFORMED IN [facility])  URINE CULTURE  CULTURE, BLOOD (ROUTINE X 2)  CULTURE, BLOOD (ROUTINE X 2)  COMPREHENSIVE METABOLIC PANEL  URINALYSIS, ROUTINE W REFLEX MICROSCOPIC  CBC WITH DIFFERENTIAL/PLATELET  LACTIC ACID, PLASMA  LACTIC ACID, PLASMA  CBG MONITORING, ED    EKG None  Radiology DG Chest Portable 1 View  Result Date: 12/29/2019 CLINICAL DATA:  Cough and fever. EXAM: PORTABLE CHEST 1 VIEW COMPARISON:  None. FINDINGS: Low lung volumes. Upper normal heart size likely accentuated by technique. Patchy opacity in the periphery of the left mid lung with additional vague bibasilar opacities. No pleural effusion, pneumothorax or pulmonary edema. No acute osseous abnormalities are seen. IMPRESSION: Patchy opacity in the periphery of the left mid lung and additional vague bibasilar opacities, suspicious for pneumonia in the setting of cough and fever. Findings can be seen in the setting of COVID 19. Electronically Signed   By: Narda Rutherford M.D.   On: 12/29/2019 14:08    Procedures Procedures (including critical care time)  Medications Ordered in ED Medications  cefTRIAXone (ROCEPHIN) 1 g in sodium chloride 0.9 % 100 mL IVPB (has no administration in time range)  azithromycin (ZITHROMAX) 500 mg in sodium chloride 0.9 % 250 mL IVPB (has no administration in time range)  acetaminophen (TYLENOL) tablet 1,000 mg (1,000 mg Oral Given 12/29/19 1339)  sodium chloride 0.9 % bolus 1,000 mL (1,000 mLs Intravenous New Bag/Given 12/29/19 1542)    ED Course  I have reviewed the triage vital signs and the nursing notes.  Pertinent labs & imaging results that were available during my care of the patient were reviewed by me and considered in my medical decision making (see chart for details).    MDM Rules/Calculators/A&P                          Patient is febrile but otherwise not ill-appearing.  His chest x-ray is abnormal, showing pneumonia. Covid  testing is negative, though could be false negative. For now, will get labs to eval for occult sepsis and start antibiotics. Care to Dr. Renaye Rakers.  Final Clinical Impression(s) / ED Diagnoses Final diagnoses:  None    Rx / DC Orders ED Discharge Orders    None       Pricilla Loveless, MD 12/29/19 1549

## 2019-12-29 NOTE — ED Provider Notes (Signed)
I assumed care of this patient from Dr Lawrence Marseilles.  69 yo male presenting with weakness and fever for 2 days.  Febrile here.  Xray showing possible PNA.  Covid test is negative.  He is not hypoxic and clinically well appearing overall.  Pending labs, w/u, will reassess afterwards.  Antibiotics ordered.  Clinical Course as of Dec 29 56  Thu Dec 29, 2019  1631 Labs returned - lactate 0.9.  WBC 6.2.  Hgb 14.6.  UA negative for leuks and nitrites, 0 WBC.  NA 139, K 4.0, Cr 1.54, Glucose 128, BUN 15, Anion gap 10, LFT's wnl   [MT]  1911 Pt remains asymptomatic on reassessment.  No hypoxia.  Discussed labs with him and his wife.  We'll start on doxycycline x 7 days for CAP.  2nd covid test is pending.  They do not want to wait any longer - I can call them if this is positive.  At this time I have a very low clinical suspicion for sepsis with normal WBC and lactate level.  I advised them that if his blood cultures are positive, he will get a phone call and needs to return immediately for IV antibiotics   [MT]    Clinical Course User Index [MT] Renay Crammer, Kermit Balo, MD      Terald Sleeper, MD 12/30/19 [phone]

## 2019-12-29 NOTE — ED Triage Notes (Signed)
Fever, cough, body aches, chills, runny nose, since yesterday. No known Covid exposure.

## 2019-12-29 NOTE — Discharge Instructions (Addendum)
You should start your antibiotics at home tomorrow morning.  Take the full course.  Follow up with your primary care doctor in 1-3 days.

## 2019-12-29 NOTE — ED Notes (Signed)
Pt. Reports he started running a fever today and has not felt himself.  Pt. Is alert and oriented with no distress.  Pt. Has no SHOB noted and is answering all questions of orientation with no difficulty.

## 2019-12-31 LAB — URINE CULTURE: Culture: NO GROWTH

## 2020-01-03 LAB — CULTURE, BLOOD (ROUTINE X 2)
Culture: NO GROWTH
Culture: NO GROWTH
Special Requests: ADEQUATE
Special Requests: ADEQUATE

## 2020-01-04 DIAGNOSIS — J189 Pneumonia, unspecified organism: Secondary | ICD-10-CM | POA: Diagnosis not present

## 2020-01-04 DIAGNOSIS — Z23 Encounter for immunization: Secondary | ICD-10-CM | POA: Diagnosis not present

## 2020-03-09 DIAGNOSIS — Z23 Encounter for immunization: Secondary | ICD-10-CM | POA: Diagnosis not present

## 2020-04-10 IMAGING — CR DG FINGER MIDDLE 2+V*R*
3 series · 3 of 3 positions shown · non-contrast
Comparison: None.

CLINICAL DATA: Crush injury to the right third finger

EXAM:
RIGHT MIDDLE FINGER 2+V

[x hand pa right]
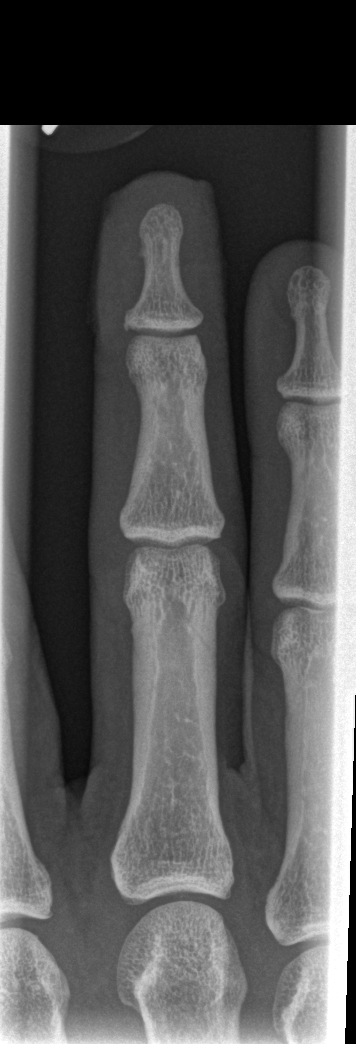

[x hand oblique right]
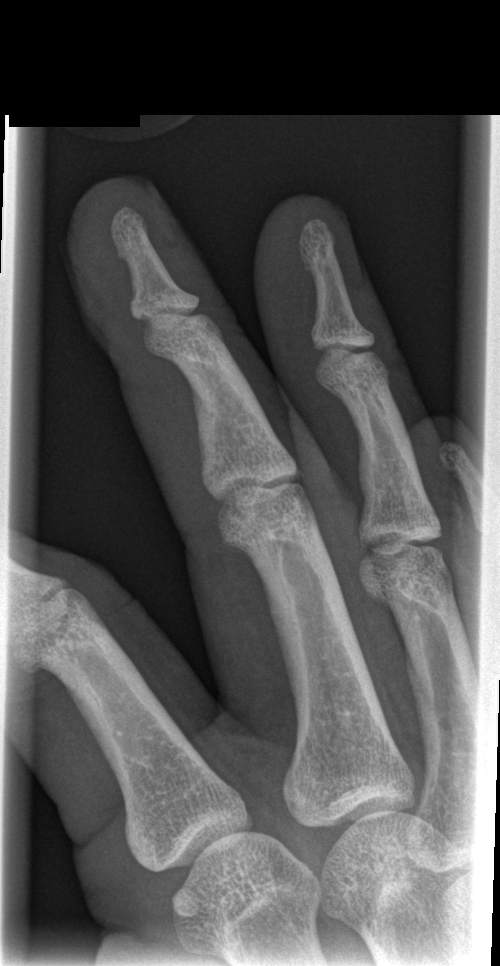

[x hand lat right]
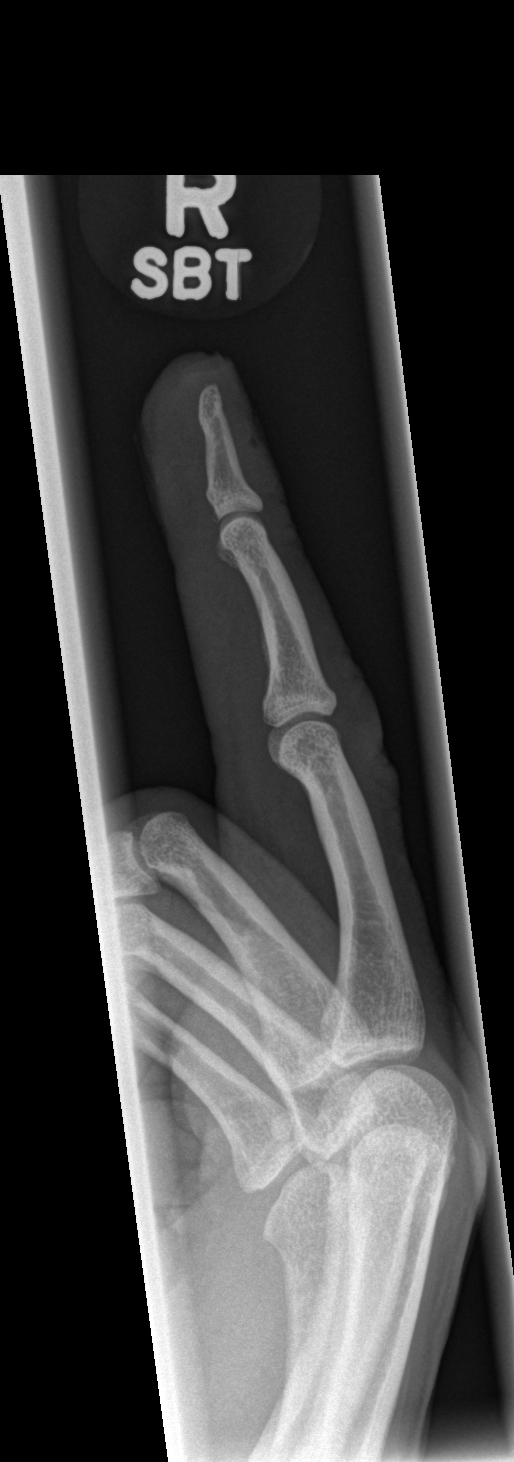

[3 of 3 positions shown; findings below may reference images not displayed]

FINDINGS: No fracture or dislocation. No suspicious focal osseous lesion.
Minimal DIP joint osteoarthritis. No radiopaque foreign body. Mild
soft tissue swelling in the distal third finger.
IMPRESSION: No fracture or malalignment.  Minimal DIP joint osteoarthritis.

## 2020-05-07 DIAGNOSIS — Z0000 Encounter for general adult medical examination without abnormal findings: Secondary | ICD-10-CM | POA: Diagnosis not present

## 2020-05-07 DIAGNOSIS — E7800 Pure hypercholesterolemia, unspecified: Secondary | ICD-10-CM | POA: Diagnosis not present

## 2020-05-07 DIAGNOSIS — Z1389 Encounter for screening for other disorder: Secondary | ICD-10-CM | POA: Diagnosis not present

## 2020-05-07 DIAGNOSIS — Z23 Encounter for immunization: Secondary | ICD-10-CM | POA: Diagnosis not present

## 2020-05-07 DIAGNOSIS — R001 Bradycardia, unspecified: Secondary | ICD-10-CM | POA: Diagnosis not present

## 2020-05-07 DIAGNOSIS — E1169 Type 2 diabetes mellitus with other specified complication: Secondary | ICD-10-CM | POA: Diagnosis not present

## 2020-05-07 DIAGNOSIS — R079 Chest pain, unspecified: Secondary | ICD-10-CM | POA: Diagnosis not present

## 2020-05-10 ENCOUNTER — Ambulatory Visit (INDEPENDENT_AMBULATORY_CARE_PROVIDER_SITE_OTHER): Payer: Medicare Other | Admitting: Internal Medicine

## 2020-05-10 ENCOUNTER — Other Ambulatory Visit: Payer: Self-pay

## 2020-05-10 ENCOUNTER — Encounter: Payer: Self-pay | Admitting: Internal Medicine

## 2020-05-10 VITALS — BP 150/80 | HR 35 | Ht 70.5 in | Wt 200.2 lb

## 2020-05-10 DIAGNOSIS — I10 Essential (primary) hypertension: Secondary | ICD-10-CM | POA: Diagnosis not present

## 2020-05-10 DIAGNOSIS — R079 Chest pain, unspecified: Secondary | ICD-10-CM

## 2020-05-10 DIAGNOSIS — E119 Type 2 diabetes mellitus without complications: Secondary | ICD-10-CM | POA: Diagnosis not present

## 2020-05-10 DIAGNOSIS — I4589 Other specified conduction disorders: Secondary | ICD-10-CM | POA: Insufficient documentation

## 2020-05-10 DIAGNOSIS — R030 Elevated blood-pressure reading, without diagnosis of hypertension: Secondary | ICD-10-CM

## 2020-05-10 NOTE — Patient Instructions (Signed)
Medication Instructions:  *If you need a refill on your cardiac medications before your next appointment, please call your pharmacy*  Testing/Procedures: Your physician has requested that you have en exercise stress myoview. For further information please visit [email or web address] Please follow instruction sheet, as given.  Follow-Up: At [facility], you and your health needs are our priority.  As part of our continuing mission to provide you with exceptional heart care, we have created designated Provider Care Teams.  These Care Teams include your primary Cardiologist (physician) and Advanced Practice Providers (APPs -  Physician Assistants and Nurse Practitioners) who all work together to provide you with the care you need, when you need it.  We recommend signing up for the patient portal called "MyChart".  Sign up information is provided on this After Visit Summary.  MyChart is used to connect with patients for Virtual Visits (Telemedicine).  Patients are able to view lab/test results, encounter notes, upcoming appointments, etc.  Non-urgent messages can be sent to your provider as well.   To learn more about what you can do with MyChart, go to [email or web address].    Your next appointment:   Your physician recommends that you schedule a follow-up appointment in: 8 WEEKS with Dr. Izora Ribas.  The format for your next appointment:   In Person with Riley Lam, MD

## 2020-05-10 NOTE — Progress Notes (Signed)
Cardiology Office Note:    Date:  05/10/2020   ID:  Andrew Craig, DOB Dec 13, 1950, MRN [id number]  PCP:  Deatra James, MD  [facility] Cardiologist:  No primary care provider on file.  Previously saw Dr. Donnie Aho [facility] Electrophysiologist:  None   CC: Chest pain Consulted for the evaluation of chest pain at the behest of Deatra James, MD  History of Present Illness:    Andrew Craig is a 70 y.o. male with a hx of history of Bradycardia, DM with HLD, CKD Stage IIIa who presents for evaluation.  Patient notes that he is feeling chest pain and shortness of breath.  Two months ago started developing chest pain:  Chest pain comes and goes.  Chest pain is an ache that does not radiate.  Discomfort occurs spontaneously, worsens with no triggers, and improves on its own.  Stable for about two months.  Patient exertion notable for buffing floors and doing a lot of mopping and occasionally brings on symptoms.  No shortness of breath at rest but has some DOE.  No PND or orthopnea.  No bendopnea, notes some weight gain, notes end of the day leg swelling that resolves overnight, and no abdominal swelling.  No syncope or near syncope .  Notes no palpitations or funny heart beats.     Patient reports prior cardiac testing including distant stress test 2017.  Past Medical History:  Diagnosis Date  . Hand pain   . Hypercholesterolemia   . Hyperlipemia   . Pneumonia     Past Surgical History:  Procedure Laterality Date  . TONSILLECTOMY      Current Medications: Current Meds  Medication Sig  . aspirin 81 MG chewable tablet Chew by mouth daily.  Marland Kitchen atorvastatin (LIPITOR) 40 MG tablet Take 40 mg by mouth daily.     Allergies:   Aspirin and Ibuprofen   Social History   Socioeconomic History  . Marital status: Married    Spouse name: Not on file  . Number of children: Not on file  . Years of education: Not on file  . Highest education level: Not on file  Occupational History  .  Not on file  Tobacco Use  . Smoking status: Never Smoker  . Smokeless tobacco: Never Used  Substance and Sexual Activity  . Alcohol use: Yes    Comment: occ  . Drug use: No  . Sexual activity: Not on file  Other Topics Concern  . Not on file  Social History Narrative  . Not on file   Social Determinants of Health   Financial Resource Strain: Not on file  Food Insecurity: Not on file  Transportation Needs: Not on file  Physical Activity: Not on file  Stress: Not on file  Social Connections: Not on file     Family History: History of coronary artery disease notable for no members. History of heart failure notable for no members. History of arrhythmia notable for no members.  ROS:   Please see the history of present illness.    All other systems reviewed and are negative.  EKGs/Labs/Other Studies Reviewed:    The following studies were reviewed today:  EKG:   05/10/20: Marked Sinus Bradycardia rate 35 with 1st HB; Borderline Anterior Infarct Pattern 12/29/19: Likely Sinus Morphology, Bradycardia with rate of 43  ECG Stress Testing: Date:2017 Results: Normal Stress Test  Recent Labs: 12/29/2019: ALT 31; BUN 15; Creatinine, Ser 1.54; Hemoglobin 14.6; Platelets 193; Potassium 4.0; Sodium 129  Recent Lipid Panel No  results found for: CHOL, TRIG, HDL, CHOLHDL, VLDL, LDLCALC, LDLDIRECT  Outside Hospital  or Outside Clinic Studies (OSH):  Date: 05/07/20 Cholesterol 173 HDL 45 LDL 104 Tgs 135 Creatinine 1.49 TSH 1.77  Risk Assessment/Calculations:     N/A  Physical Exam:    VS:  BP (!) 150/80   Pulse (!) 35   Ht 5' 10.5" (1.791 m)   Wt 200 lb 3.2 oz (90.8 kg)   SpO2 99%   BMI 28.32 kg/m     Wt Readings from Last 3 Encounters:  05/10/20 200 lb 3.2 oz (90.8 kg)  12/29/19 179 lb (81.2 kg)  01/19/18 182 lb 15.7 oz (83 kg)   GEN:  Well nourished, well developed in no acute distress HEENT: Normal NECK: No JVD; No carotid bruits LYMPHATICS: No  lymphadenopathy CARDIAC: Regular Bradycardia, no murmurs, rubs, gallops RESPIRATORY:  Clear to auscultation without rales, wheezing or rhonchi  ABDOMEN: Soft, non-tender, non-distended MUSCULOSKELETAL:  No edema; No deformity  SKIN: Warm and dry NEUROLOGIC:  Alert and oriented x 3 PSYCHIATRIC:  Normal affect   ASSESSMENT:    1. Chest pain of uncertain etiology   2. Diabetes mellitus with coincident hypertension (HCC)   3. Chronotropic incompetence   4. Elevated blood pressure reading    PLAN:    Chest Pain Syndrome Chronotropic incompetence on no AV nodal agents DM with HLD Elevated BP reading in the setting of marked bradycardia - The patient presents with possibly cardiac pain and low heart reate - Would recommend exercise nuclear medicine stress test (NPO at midnight); discussed risks, benefits, and alternatives of the diagnostic procedure including chest pain, arrhythmia, and death.  Patient amenable for testing. - low threshold to EP follow up - continue present statin - will defer BP medication as this could be compensatory in the setting of marked sinus bradycardia  Shared Decision Making/Informed Consent The risks [chest pain, shortness of breath, cardiac arrhythmias, dizziness, blood pressure fluctuations, myocardial infarction, stroke/transient ischemic attack, nausea, vomiting, allergic reaction, radiation exposure, metallic taste sensation and life-threatening complications (estimated to be 1 in 10,000)], benefits (risk stratification, diagnosing coronary artery disease, treatment guidance) and alternatives of a nuclear stress test were discussed in detail with Mr. Knierim and he agrees to proceed.   2-3 follow up unless new symptoms or abnormal test results warranting change in plan  Would be reasonable for  Virtual Follow up Would be reasonable for  APP Follow up    Medication Adjustments/Labs and Tests Ordered: Current medicines are reviewed at length with the  patient today.  Concerns regarding medicines are outlined above.  No orders of the defined types were placed in this encounter.  No orders of the defined types were placed in this encounter.   Patient Instructions  Medication Instructions:  *If you need a refill on your cardiac medications before your next appointment, please call your pharmacy*  Testing/Procedures: Your physician has requested that you have en exercise stress myoview. For further information please visit [email or web address] Please follow instruction sheet, as given.  Follow-Up: At [facility], you and your health needs are our priority.  As part of our continuing mission to provide you with exceptional heart care, we have created designated Provider Care Teams.  These Care Teams include your primary Cardiologist (physician) and Advanced Practice Providers (APPs -  Physician Assistants and Nurse Practitioners) who all work together to provide you with the care you need, when you need it.  We recommend signing up for the patient portal called "MyChart".  Sign up information is provided on this After Visit Summary.  MyChart is used to connect with patients for Virtual Visits (Telemedicine).  Patients are able to view lab/test results, encounter notes, upcoming appointments, etc.  Non-urgent messages can be sent to your provider as well.   To learn more about what you can do with MyChart, go to [email or web address].    Your next appointment:   Your physician recommends that you schedule a follow-up appointment in: 8 WEEKS with Dr. Izora Ribas.  The format for your next appointment:   In Person with Riley Lam, MD        Signed, Christell Constant, MD  05/10/2020 4:20 PM    [facility]

## 2020-05-16 ENCOUNTER — Telehealth (HOSPITAL_COMMUNITY): Payer: Self-pay | Admitting: Radiology

## 2020-05-16 ENCOUNTER — Telehealth (HOSPITAL_COMMUNITY): Payer: Self-pay | Admitting: *Deleted

## 2020-05-16 ENCOUNTER — Encounter (HOSPITAL_COMMUNITY): Payer: Self-pay | Admitting: *Deleted

## 2020-05-16 NOTE — Telephone Encounter (Signed)
Patient given detailed instructions per Myocardial Perfusion Study Information Sheet for the test on 05/23/2018 at 10am. Patient notified to arrive 15 minutes early and that it is imperative to arrive on time for appointment to keep from having the test rescheduled.  If you need to cancel or reschedule your appointment, please call the office within 24 hours of your appointment. . Patient verbalized understanding.EHK

## 2020-05-16 NOTE — Telephone Encounter (Signed)
Left message on voicemail in reference to upcoming appointment scheduled for 05/23/20. Phone number given for a call back so details instructions can be given. Andrew Craig W Mychart letter sent with instructions.  

## 2020-05-16 NOTE — Addendum Note (Signed)
Addended by: Riley Lam A on: 05/16/2020 10:44 AM   Modules accepted: Orders

## 2020-05-19 ENCOUNTER — Other Ambulatory Visit (HOSPITAL_COMMUNITY)
Admission: RE | Admit: 2020-05-19 | Discharge: 2020-05-19 | Disposition: A | Discharge status: Home / Self Care | Payer: Medicare Other | Source: Ambulatory Visit | Attending: Internal Medicine | Admitting: Internal Medicine

## 2020-05-19 DIAGNOSIS — Z20822 Contact with and (suspected) exposure to covid-19: Secondary | ICD-10-CM | POA: Diagnosis not present

## 2020-05-19 DIAGNOSIS — Z01812 Encounter for preprocedural laboratory examination: Secondary | ICD-10-CM | POA: Diagnosis not present

## 2020-05-19 LAB — SARS CORONAVIRUS 2 (TAT 6-24 HRS): SARS Coronavirus 2: NEGATIVE

## 2020-05-23 ENCOUNTER — Ambulatory Visit (HOSPITAL_COMMUNITY): Payer: Medicare Other | Attending: Cardiology

## 2020-05-23 ENCOUNTER — Other Ambulatory Visit: Payer: Self-pay

## 2020-05-23 DIAGNOSIS — R079 Chest pain, unspecified: Secondary | ICD-10-CM | POA: Diagnosis not present

## 2020-05-23 DIAGNOSIS — I4589 Other specified conduction disorders: Secondary | ICD-10-CM | POA: Insufficient documentation

## 2020-05-23 LAB — MYOCARDIAL PERFUSION IMAGING
Estimated workload: 9 METS
Exercise duration (min): 7 min
Exercise duration (sec): 20 s
LV dias vol: 81 mL (ref 62–150)
LV sys vol: 29 mL
MPHR: 149 {beats}/min
Peak HR: 134 {beats}/min
Percent HR: 89 %
RPE: 19
Rest HR: 35 {beats}/min
SDS: 0
SRS: 0
SSS: 0
TID: 0.88

## 2020-05-23 MED ORDER — TECHNETIUM TC 99M TETROFOSMIN IV KIT
32.1000 | PACK | Freq: Once | INTRAVENOUS | Status: AC | PRN
  Administered 2020-05-23: 32.1 via INTRAVENOUS
  Filled 2020-05-23: qty 33

## 2020-05-23 MED ORDER — TECHNETIUM TC 99M TETROFOSMIN IV KIT
11.0000 | PACK | Freq: Once | INTRAVENOUS | Status: AC | PRN
  Administered 2020-05-23: 11 via INTRAVENOUS
  Filled 2020-05-23: qty 11

## 2020-06-28 ENCOUNTER — Encounter: Payer: Self-pay | Admitting: Internal Medicine

## 2020-06-28 ENCOUNTER — Encounter: Payer: Self-pay | Admitting: Radiology

## 2020-06-28 ENCOUNTER — Other Ambulatory Visit: Payer: Self-pay

## 2020-06-28 ENCOUNTER — Ambulatory Visit (INDEPENDENT_AMBULATORY_CARE_PROVIDER_SITE_OTHER): Payer: Medicare Other

## 2020-06-28 ENCOUNTER — Ambulatory Visit (INDEPENDENT_AMBULATORY_CARE_PROVIDER_SITE_OTHER): Payer: Medicare Other | Admitting: Internal Medicine

## 2020-06-28 VITALS — BP 130/76 | HR 36 | Ht 70.5 in | Wt 199.4 lb

## 2020-06-28 DIAGNOSIS — I440 Atrioventricular block, first degree: Secondary | ICD-10-CM

## 2020-06-28 DIAGNOSIS — E785 Hyperlipidemia, unspecified: Secondary | ICD-10-CM | POA: Diagnosis not present

## 2020-06-28 DIAGNOSIS — R001 Bradycardia, unspecified: Secondary | ICD-10-CM | POA: Diagnosis not present

## 2020-06-28 DIAGNOSIS — E1169 Type 2 diabetes mellitus with other specified complication: Secondary | ICD-10-CM

## 2020-06-28 DIAGNOSIS — R030 Elevated blood-pressure reading, without diagnosis of hypertension: Secondary | ICD-10-CM | POA: Diagnosis not present

## 2020-06-28 DIAGNOSIS — E7800 Pure hypercholesterolemia, unspecified: Secondary | ICD-10-CM | POA: Diagnosis not present

## 2020-06-28 NOTE — Progress Notes (Signed)
Cardiology Office Note:    Date:  06/28/2020   ID:  Andrew Craig, DOB 1950-07-19, MRN [id number]  PCP:  Deatra James, MD  [facility] Cardiologist:  Riley Lam MD [facility] Electrophysiologist:  None   CC: Chest pain f/u  History of Present Illness:    Andrew Craig is a 70 y.o. male with a hx of history of Bradycardia, DM with HLD, CKD Stage IIIa who presented for evaluation 05/10/20. In interim of this visit, patient no evidence of blockages or chronotropic incompetence on exercise NM Stress. Had notable hypertensive response.  Seen 06/28/20.  Patient notes that he is doing good.  Since last visit notes no chest pain with changing his eating habits.  Relevant interval testing or therapy include stress testing (had no issues during testing).  There are no interval hospital/ED visit.    No chest pain or pressure.  No SOB/DOE and no PND/Orthopnea.  No weight gain or leg swelling.  No palpitations or syncope.  No fatigue.  Ambulatory blood pressure not done.   Past Medical History:  Diagnosis Date  . Hand pain   . Hypercholesterolemia   . Hyperlipemia   . Pneumonia     Past Surgical History:  Procedure Laterality Date  . TONSILLECTOMY      Current Medications: Current Meds  Medication Sig  . aspirin 81 MG chewable tablet Chew by mouth daily.  Marland Kitchen atorvastatin (LIPITOR) 40 MG tablet Take 40 mg by mouth daily.     Allergies:   Aspirin and Ibuprofen   Social History   Socioeconomic History  . Marital status: Married    Spouse name: Not on file  . Number of children: Not on file  . Years of education: Not on file  . Highest education level: Not on file  Occupational History  . Not on file  Tobacco Use  . Smoking status: Never Smoker  . Smokeless tobacco: Never Used  Substance and Sexual Activity  . Alcohol use: Yes    Comment: occ  . Drug use: No  . Sexual activity: Not on file  Other Topics Concern  . Not on file  Social History Narrative  . Not  on file   Social Determinants of Health   Financial Resource Strain: Not on file  Food Insecurity: Not on file  Transportation Needs: Not on file  Physical Activity: Not on file  Stress: Not on file  Social Connections: Not on file     Family History: History of coronary artery disease notable for no members. History of heart failure notable for no members. History of arrhythmia notable for no members.  ROS:   Please see the history of present illness.    All other systems reviewed and are negative.  EKGs/Labs/Other Studies Reviewed:    The following studies were reviewed today:  EKG:   06/28/20: Marked Sinus Bradycardia 1st HB with 1st HB  05/10/20: Marked Sinus Bradycardia rate 35 with 1st HB; Borderline Anterior Infarct Pattern 12/29/19: Likely Sinus Morphology, Bradycardia with rate of 43  ECG Stress Testing: Date:2017 Results: Normal Stress Test  NM Stress Testing : Date: 06/28/20 Results:  Nuclear stress EF: 64%.  There was no ST segment deviation noted during stress.  No T wave inversion was noted during stress.  The study is normal.  This is a low risk study.  The left ventricular ejection fraction is normal (55-65%).    Recent Labs: 12/29/2019: ALT 31; BUN 15; Creatinine, Ser 1.54; Hemoglobin 14.6; Platelets 193; Potassium 4.0;  Sodium 129  Recent Lipid Panel No results found for: CHOL, TRIG, HDL, CHOLHDL, VLDL, LDLCALC, LDLDIRECT  Outside Hospital  or Outside Clinic Studies (OSH):  Date: 05/07/20 Cholesterol 173 HDL 45 LDL 104 Tgs 135 Creatinine 1.49 TSH 1.77  Risk Assessment/Calculations:     N/A  Physical Exam:    VS:  BP 130/76   Pulse (!) 36   Ht 5' 10.5" (1.791 m)   Wt 199 lb 6.4 oz (90.4 kg)   SpO2 98%   BMI 28.21 kg/m     Wt Readings from Last 3 Encounters:  06/28/20 199 lb 6.4 oz (90.4 kg)  05/10/20 200 lb 3.2 oz (90.8 kg)  12/29/19 179 lb (81.2 kg)   GEN:  Well nourished, well developed in no acute distress HEENT:  Normal NECK: No JVD; No carotid bruits LYMPHATICS: No lymphadenopathy CARDIAC: Regular Bradycardia, no murmurs, rubs, gallops RESPIRATORY:  Clear to auscultation without rales, wheezing or rhonchi  ABDOMEN: Soft, non-tender, non-distended MUSCULOSKELETAL:  No edema; No deformity  SKIN: Warm and dry NEUROLOGIC:  Alert and oriented x 3 PSYCHIATRIC:  Normal affect   ASSESSMENT:    1. Bradycardia   2. Heart block AV first degree   3. Elevated blood pressure reading   4. Hyperlipidemia associated with type 2 diabetes mellitus (HCC)    PLAN:     Sinus Bradycardia with 1st degree heart block - will obtain 14-day non live heart monitor (ZioPatch) largely to r/o complete HB - patient is completely asymptomatic; discussed fatigue, near syncope and other symptoms that would warrant EP evaluation for PPM  Elevated blood pressure reading - ambulatory blood pressure not done, will start ambulatory BP monitoring; gave education on how to perform ambulatory blood pressure monitoring including the frequency and technique; goal ambulatory blood pressure < 135/85 on average - discussed diet (DASH/low sodium), and exercise/weight loss interventions   Hyperlipidemia (HLD with DM) -LDL goal less than 100 (near goal) -continue current statin -Recheck lipid profile LFTs - gave education on dietary changes  Summer to Late Fall follow up unless new symptoms or abnormal test results warranting change in plan  Would be reasonable for  Video Visit Follow up Would be reasonable for  APP Follow up  Medication Adjustments/Labs and Tests Ordered: Current medicines are reviewed at length with the patient today.  Concerns regarding medicines are outlined above.  Orders Placed This Encounter  Procedures  . LONG TERM MONITOR (3-14 DAYS)  . EKG 12-Lead   No orders of the defined types were placed in this encounter.   Patient Instructions  Medication Instructions:  Your physician recommends that you  continue on your current medications as directed. Please refer to the Current Medication list given to you today.  *If you need a refill on your cardiac medications before your next appointment, please call your pharmacy*   Lab Work: NONE If you have labs (blood work) drawn today and your tests are completely normal, you will receive your results only by: Marland Kitchen. MyChart Message (if you have MyChart) OR . A paper copy in the mail If you have any lab test that is abnormal or we need to change your treatment, we will call you to review the results.   Testing/Procedures: Your physician has requested that you wear a heart monitor.   Follow-Up: At [facility], you and your health needs are our priority.  As part of our continuing mission to provide you with exceptional heart care, we have created designated Provider Care Teams.  These  Care Teams include your primary Cardiologist (physician) and Advanced Practice Providers (APPs -  Physician Assistants and Nurse Practitioners) who all work together to provide you with the care you need, when you need it.  We recommend signing up for the patient portal called "MyChart".  Sign up information is provided on this After Visit Summary.  MyChart is used to connect with patients for Virtual Visits (Telemedicine).  Patients are able to view lab/test results, encounter notes, upcoming appointments, etc.  Non-urgent messages can be sent to your provider as well.   To learn more about what you can do with MyChart, go to [email or web address].    Your next appointment:   5 month(s)  The format for your next appointment:   In Person  Provider:   You may see Izora Ribas, MD or one of the following Advanced Practice Providers on your designated Care Team:    Ronie Spies, PA-C  Jacolyn Reedy, PA-C    Other Instructions  Christena Deem- Long Term Monitor Instructions   Your physician has requested you wear your ZIO patch monitor 14 days.   This is a  single patch monitor.  [employer] supplies one patch monitor per enrollment.  Additional stickers are not available.   Please do not apply patch if you will be having a Nuclear Stress Test, Echocardiogram, Cardiac CT, MRI, or Chest Xray during the time frame you would be wearing the monitor. The patch cannot be worn during these tests.  You cannot remove and re-apply the ZIO XT patch monitor.   Your ZIO patch monitor will be sent USPS Priority mail from [facility] directly to your home address. The monitor may also be mailed to a PO BOX if home delivery is not available.   It may take 3-5 days to receive your monitor after you have been enrolled.   Once you have received you monitor, please review enclosed instructions.  Your monitor has already been registered assigning a specific monitor serial # to you.   Applying the monitor   Shave hair from upper left chest.   Hold abrader disc by orange tab.  Rub abrader in 40 strokes over left upper chest as indicated in your monitor instructions.   Clean area with 4 enclosed alcohol pads .  Use all pads to assure are is cleaned thoroughly.  Let dry.   Apply patch as indicated in monitor instructions.  Patch will be place under collarbone on left side of chest with arrow pointing upward.   Rub patch adhesive wings for 2 minutes.Remove white label marked "1".  Remove white label marked "2".  Rub patch adhesive wings for 2 additional minutes.   While looking in a mirror, press and release button in center of patch.  A small green light will flash 3-4 times .  This will be your only indicator the monitor has been turned on.     Do not shower for the first 24 hours.  You may shower after the first 24 hours.   Press button if you feel a symptom. You will hear a small click.  Record Date, Time and Symptom in the Patient Log Book.   When you are ready to remove patch, follow instructions on last 2 pages of Patient Log Book.  Stick patch monitor onto  last page of Patient Log Book.   Place Patient Log Book in [place] box.  Use locking tab on box and tape box closed securely.  The [place] and [employer] has [employer] on  it.  Please place in mailbox as soon as possible.  Your physician should have your test results approximately 7 days after the monitor has been mailed back to [facility].   Call [facility] Customer Care at [phone] if you have questions regarding your ZIO XT patch monitor.  Call them immediately if you see an orange light blinking on your monitor.   If your monitor falls off in less than 4 days contact our Monitor department at [phone].  If your monitor becomes loose or falls off after 4 days call [employer] at [phone] for suggestions on securing your monitor.       Signed, Christell Constant, MD  06/28/2020 10:35 AM    [facility]

## 2020-06-28 NOTE — Progress Notes (Signed)
Enrolled patient for a 14 day Zio XT Monitor to be mailed to patients home  

## 2020-06-28 NOTE — Patient Instructions (Signed)
Medication Instructions:  Your physician recommends that you continue on your current medications as directed. Please refer to the Current Medication list given to you today.  *If you need a refill on your cardiac medications before your next appointment, please call your pharmacy*   Lab Work: NONE If you have labs (blood work) drawn today and your tests are completely normal, you will receive your results only by: Marland Kitchen MyChart Message (if you have MyChart) OR . A paper copy in the mail If you have any lab test that is abnormal or we need to change your treatment, we will call you to review the results.   Testing/Procedures: Your physician has requested that you wear a heart monitor.   Follow-Up: At [facility], you and your health needs are our priority.  As part of our continuing mission to provide you with exceptional heart care, we have created designated Provider Care Teams.  These Care Teams include your primary Cardiologist (physician) and Advanced Practice Providers (APPs -  Physician Assistants and Nurse Practitioners) who all work together to provide you with the care you need, when you need it.  We recommend signing up for the patient portal called "MyChart".  Sign up information is provided on this After Visit Summary.  MyChart is used to connect with patients for Virtual Visits (Telemedicine).  Patients are able to view lab/test results, encounter notes, upcoming appointments, etc.  Non-urgent messages can be sent to your provider as well.   To learn more about what you can do with MyChart, go to [email or web address].    Your next appointment:   5 month(s)  The format for your next appointment:   In Person  Provider:   You may see Izora Ribas, MD or one of the following Advanced Practice Providers on your designated Care Team:    Ronie Spies, PA-C  Jacolyn Reedy, PA-C    Other Instructions  Christena Deem- Long Term Monitor Instructions   Your physician has  requested you wear your ZIO patch monitor 14 days.   This is a single patch monitor.  [employer] supplies one patch monitor per enrollment.  Additional stickers are not available.   Please do not apply patch if you will be having a Nuclear Stress Test, Echocardiogram, Cardiac CT, MRI, or Chest Xray during the time frame you would be wearing the monitor. The patch cannot be worn during these tests.  You cannot remove and re-apply the ZIO XT patch monitor.   Your ZIO patch monitor will be sent USPS Priority mail from [facility] directly to your home address. The monitor may also be mailed to a PO BOX if home delivery is not available.   It may take 3-5 days to receive your monitor after you have been enrolled.   Once you have received you monitor, please review enclosed instructions.  Your monitor has already been registered assigning a specific monitor serial # to you.   Applying the monitor   Shave hair from upper left chest.   Hold abrader disc by orange tab.  Rub abrader in 40 strokes over left upper chest as indicated in your monitor instructions.   Clean area with 4 enclosed alcohol pads .  Use all pads to assure are is cleaned thoroughly.  Let dry.   Apply patch as indicated in monitor instructions.  Patch will be place under collarbone on left side of chest with arrow pointing upward.   Rub patch adhesive wings for 2 minutes.Remove white label marked "1".  Remove  white label marked "2".  Rub patch adhesive wings for 2 additional minutes.   While looking in a mirror, press and release button in center of patch.  A small green light will flash 3-4 times .  This will be your only indicator the monitor has been turned on.     Do not shower for the first 24 hours.  You may shower after the first 24 hours.   Press button if you feel a symptom. You will hear a small click.  Record Date, Time and Symptom in the Patient Log Book.   When you are ready to remove patch, follow  instructions on last 2 pages of Patient Log Book.  Stick patch monitor onto last page of Patient Log Book.   Place Patient Log Book in [place] box.  Use locking tab on box and tape box closed securely.  The [employer] and [employer] has [employer] on it.  Please place in mailbox as soon as possible.  Your physician should have your test results approximately 7 days after the monitor has been mailed back to [facility].   Call [facility] Customer Care at [phone] if you have questions regarding your ZIO XT patch monitor.  Call them immediately if you see an orange light blinking on your monitor.   If your monitor falls off in less than 4 days contact our Monitor department at [phone].  If your monitor becomes loose or falls off after 4 days call [employer] at [phone] for suggestions on securing your monitor.

## 2020-07-04 DIAGNOSIS — R001 Bradycardia, unspecified: Secondary | ICD-10-CM

## 2020-07-23 ENCOUNTER — Telehealth: Payer: Self-pay | Admitting: Internal Medicine

## 2020-07-23 ENCOUNTER — Telehealth: Payer: Self-pay

## 2020-07-23 DIAGNOSIS — R001 Bradycardia, unspecified: Secondary | ICD-10-CM | POA: Diagnosis not present

## 2020-07-23 NOTE — Telephone Encounter (Signed)
Emily from [employer] calling with abnormal zio patch results. 

## 2020-07-23 NOTE — Telephone Encounter (Signed)
-----   Message from Christell Constant, MD sent at 07/23/2020  4:10 PM EDT ----- Results: Significant Pauses (asymptomatic) Plan: Expedited EP evaluation Called Patient X1- left voicemail  Christell Constant, MD

## 2020-07-23 NOTE — Telephone Encounter (Signed)
Patient has seen results and MD comments released through My Chart. Urgent EP referral placed per Dr. Izora Ribas request. Message sent to EP scheduler for urgent appointment request.

## 2020-07-23 NOTE — Telephone Encounter (Signed)
Reviewed results:  3-4 second pauses that are not all vagotonic in nature.  No symptoms triggered.  Small runs of SVT noted. - HM results are not in my reading work list; can the be moved to I can finalize the HM findings? - we should send to EP for discussion of pacemaker given the degree of his pauses.  Thanks,  Christell Constant, MD

## 2020-07-23 NOTE — Telephone Encounter (Signed)
HM results received and ready to be reviewed.

## 2020-07-31 ENCOUNTER — Ambulatory Visit (INDEPENDENT_AMBULATORY_CARE_PROVIDER_SITE_OTHER): Payer: Medicare Other | Admitting: Cardiology

## 2020-07-31 ENCOUNTER — Institutional Professional Consult (permissible substitution): Payer: Medicare Other | Admitting: Cardiology

## 2020-07-31 ENCOUNTER — Encounter: Payer: Self-pay | Admitting: Cardiology

## 2020-07-31 ENCOUNTER — Other Ambulatory Visit: Payer: Self-pay

## 2020-07-31 VITALS — BP 138/84 | HR 37 | Ht 70.5 in | Wt 199.6 lb

## 2020-07-31 DIAGNOSIS — I495 Sick sinus syndrome: Secondary | ICD-10-CM | POA: Diagnosis not present

## 2020-07-31 DIAGNOSIS — R001 Bradycardia, unspecified: Secondary | ICD-10-CM | POA: Diagnosis not present

## 2020-07-31 NOTE — Progress Notes (Signed)
Electrophysiology Office Note:    Date:  07/31/2020   ID:  Andrew Craig, DOB 1950-07-06, MRN [id number]  PCP:  Deatra James, MD  [facility] Cardiologist:  Christell Constant, MD  [facility] Electrophysiologist:  Lanier Prude, MD   Referring MD: Riley Lam A*   Chief Complaint: Sinus node dysfunction  History of Present Illness:    Andrew Craig is a 70 y.o. male who presents for an evaluation of sinus node dysfunction at the request of Dr. Izora Ribas. Their medical history includes hyperlipidemia.  The patient was last seen by Dr. Izora Ribas on June 28, 2020.  This appointment was in the setting of chest discomfort.  This was evaluated with a nuclear stress test that was not suggestive of severe ischemic heart disease.  At that appointment a 14-day ZIO monitor was ordered.  The patient tells me he is very active and does not have any symptoms related to his bradycardia or brief pauses.  He denies a history of syncope or presyncope.  Past Medical History:  Diagnosis Date  . Hand pain   . Hypercholesterolemia   . Hyperlipemia   . Pneumonia     Past Surgical History:  Procedure Laterality Date  . TONSILLECTOMY      Current Medications: Current Meds  Medication Sig  . aspirin 81 MG chewable tablet Chew by mouth daily.  Marland Kitchen atorvastatin (LIPITOR) 40 MG tablet Take 40 mg by mouth daily.     Allergies:   Aspirin and Ibuprofen   Social History   Socioeconomic History  . Marital status: Married    Spouse name: Not on file  . Number of children: Not on file  . Years of education: Not on file  . Highest education level: Not on file  Occupational History  . Not on file  Tobacco Use  . Smoking status: Never Smoker  . Smokeless tobacco: Never Used  Substance and Sexual Activity  . Alcohol use: Yes    Comment: occ  . Drug use: No  . Sexual activity: Not on file  Other Topics Concern  . Not on file  Social History Narrative  . Not on file    Social Determinants of Health   Financial Resource Strain: Not on file  Food Insecurity: Not on file  Transportation Needs: Not on file  Physical Activity: Not on file  Stress: Not on file  Social Connections: Not on file     Family History: The patient's family history is not on file.  ROS:   Please see the history of present illness.    All other systems reviewed and are negative.  EKGs/Labs/Other Studies Reviewed:    The following studies were reviewed today:  July 23, 2020 ZIO personally reviewed Heart rate 24-169, average 48 bpm.  Sinus rhythm. 11 episodes of SVT with a maximum rate of 169 bpm. Rare supraventricular and ventricular ectopy First-degree AV delay Multiple pauses, predominantly nocturnal   EKG:  The ekg ordered today demonstrates sinus arrhythmia and bradycardia  Recent Labs: 12/29/2019: ALT 31; BUN 15; Creatinine, Ser 1.54; Hemoglobin 14.6; Platelets 193; Potassium 4.0; Sodium 129  Recent Lipid Panel No results found for: CHOL, TRIG, HDL, CHOLHDL, VLDL, LDLCALC, LDLDIRECT  Physical Exam:    VS:  BP 138/84   Pulse (!) 37   Ht 5' 10.5" (1.791 m)   Wt 199 lb 9.6 oz (90.5 kg)   SpO2 98%   BMI 28.23 kg/m     Wt Readings from Last 3 Encounters:  07/31/20  199 lb 9.6 oz (90.5 kg)  06/28/20 199 lb 6.4 oz (90.4 kg)  05/10/20 200 lb 3.2 oz (90.8 kg)     GEN:  Well nourished, well developed in no acute distress HEENT: Normal NECK: No JVD; No carotid bruits LYMPHATICS: No lymphadenopathy CARDIAC: Irregular rhythm, bradycardia, no murmurs, rubs, gallops RESPIRATORY:  Clear to auscultation without rales, wheezing or rhonchi  ABDOMEN: Soft, non-tender, non-distended MUSCULOSKELETAL:  No edema; No deformity  SKIN: Warm and dry NEUROLOGIC:  Alert and oriented x 3 PSYCHIATRIC:  Normal affect   ASSESSMENT:    1. Sinus node dysfunction (HCC)   2. Bradycardia    PLAN:    In order of problems listed above:  1. Patient has evidence of sinus node  dysfunction on recent ZIO monitor.  This is asymptomatic.  No syncopal history.  At this point given the asymptomatic nature of his bradycardia would favor a conservative approach.  We discussed the warning signs of more advanced conduction system disease that would prompt an urgent visit to clinic or the emergency department.  I will plan to see him back in 6 months or sooner as needed.  We discussed the possibility of him needing a permanent pacemaker in the future.  Total time spent with patient today 45 minutes. This includes reviewing records, evaluating the patient and coordinating care.  Medication Adjustments/Labs and Tests Ordered: Current medicines are reviewed at length with the patient today.  Concerns regarding medicines are outlined above.  Orders Placed This Encounter  Procedures  . EKG 12-Lead   No orders of the defined types were placed in this encounter.    Signed, Steffanie Dunn, MD, [facility]  07/31/2020 6:36 PM    Electrophysiology [facility]

## 2020-07-31 NOTE — Patient Instructions (Addendum)
Medication Instructions:  Your physician recommends that you continue on your current medications as directed. Please refer to the Current Medication list given to you today.  Labwork: None ordered.  Testing/Procedures: None ordered.  Follow-Up: Your physician wants you to follow-up in: 6 months with Dr. Lambert.   Any Other Special Instructions Will Be Listed Below (If Applicable).  If you need a refill on your cardiac medications before your next appointment, please call your pharmacy.         

## 2020-11-05 DIAGNOSIS — R001 Bradycardia, unspecified: Secondary | ICD-10-CM | POA: Diagnosis not present

## 2020-11-05 DIAGNOSIS — E7800 Pure hypercholesterolemia, unspecified: Secondary | ICD-10-CM | POA: Diagnosis not present

## 2020-11-05 DIAGNOSIS — E1169 Type 2 diabetes mellitus with other specified complication: Secondary | ICD-10-CM | POA: Diagnosis not present

## 2021-01-03 DIAGNOSIS — H40023 Open angle with borderline findings, high risk, bilateral: Secondary | ICD-10-CM | POA: Diagnosis not present

## 2021-01-03 DIAGNOSIS — H2513 Age-related nuclear cataract, bilateral: Secondary | ICD-10-CM | POA: Diagnosis not present

## 2021-04-09 NOTE — Progress Notes (Signed)
Cardiology Office Note:    Date:  04/10/2021   ID:  Andrew Craig, DOB Oct 16, 1950, MRN [id number]  PCP:  Deatra James, MD  [facility] Cardiologist:  Riley Lam MD [facility] Electrophysiologist:  Lanier Prude, MD   CC: Chest pain f/u  History of Present Illness:    Andrew Craig is a 70 y.o. male with a hx of history of Bradycardia, DM with HLD, CKD Stage IIIa who presented for evaluation 05/10/20. In interim of this visit, patient no evidence of blockages or chronotropic incompetence on exercise NM Stress. Had notable hypertensive response.  Seen 06/28/20.  In interim of this visit, patient saw EP for pauses with no plans for PPM. 04/10/21  Patient notes that he is still working at his own [employer].  He stays pretty busy because is hard to find.   Since day prior/last visit notes that his LDL has improved since prior . There are no interval hospital/ED visit.    No chest pain or pressure .  No SOB/DOE and no PND/Orthopnea.  No weight gain or leg swelling.  No palpitations or syncope.   Past Medical History:  Diagnosis Date   Hand pain    Hypercholesterolemia    Hyperlipemia    Pneumonia     Past Surgical History:  Procedure Laterality Date   TONSILLECTOMY      Current Medications: Current Meds  Medication Sig   aspirin 81 MG chewable tablet Chew by mouth daily.   atorvastatin (LIPITOR) 40 MG tablet Take 40 mg by mouth daily.   cholecalciferol (VITAMIN D3) 25 MCG (1000 UNIT) tablet Take 1,000 Units by mouth daily.   zinc gluconate 50 MG tablet Take 50 mg by mouth daily.     Allergies:   Aspirin and Ibuprofen   Social History   Socioeconomic History   Marital status: Married    Spouse name: Not on file   Number of children: Not on file   Years of education: Not on file   Highest education level: Not on file  Occupational History   Not on file  Tobacco Use   Smoking status: Never   Smokeless tobacco: Never  Vaping Use   Vaping Use:  Every day  Substance and Sexual Activity   Alcohol use: Yes    Comment: occ   Drug use: No   Sexual activity: Not on file  Other Topics Concern   Not on file  Social History Narrative   Not on file   Social Determinants of Health   Financial Resource Strain: Not on file  Food Insecurity: Not on file  Transportation Needs: Not on file  Physical Activity: Not on file  Stress: Not on file  Social Connections: Not on file    Social: still working, has his own janitorial service, 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 History of heart failure notable for no members. History of arrhythmia notable for no members.  ROS:   Please see the history of present illness.    All other systems reviewed and are negative.  EKGs/Labs/Other Studies Reviewed:    The following studies were reviewed today:  EKG:   04/10/21:  Marked sinus bradycardia 1st HB Rare junctional beat 06/28/20: Marked Sinus Bradycardia 1st HB 05/10/20: Marked Sinus Bradycardia rate 35 with 1st HB; Borderline Anterior Infarct Pattern 12/29/19: Likely Sinus Morphology, Bradycardia with rate of 43  Cardiac Event Monitoring: Date: 07/23/20 Results: Patient had a minimum heart rate of  24 bpm, maximum heart rate of 169 bpm, and average heart rate of 48 bpm. Predominant underlying rhythm was sinus rhythm. Eleven runs of supraventricular tachycardia occurred lasting 11 beats at longest with a max rate of 169 bpm at fastest. Isolated PACs were rare (<1.0%), with rare couplets and triplets present. Isolated PVCs were rare (<1.0%), with rare couplets present. First heart AV block. Multiple episodes of both vasotonic sinus pauses, heart rates 30 bpm (predominantly nocturnal), with pauses occurring during the waking hours (7:31 PM). Triggered and diary events associated with sinus rhythm.   Marked sinus bradycardia with nocturnal pauses.   NM Stress Testing : Date:  06/28/20 Results: Nuclear stress EF: 64%. There was no ST segment deviation noted during stress. No T wave inversion was noted during stress. The study is normal. This is a low risk study. The left ventricular ejection fraction is normal (55-65%).    Recent Labs: No results found for requested labs within last 8760 hours.  Recent Lipid Panel No results found for: CHOL, TRIG, HDL, CHOLHDL, VLDL, LDLCALC, LDLDIRECT   Physical Exam:    VS:  BP 130/60    Pulse (!) 41    Ht 5' 10.5" (1.791 m)    Wt 204 lb (92.5 kg)    SpO2 96%    BMI 28.86 kg/m     Wt Readings from Last 3 Encounters:  04/10/21 204 lb (92.5 kg)  07/31/20 199 lb 9.6 oz (90.5 kg)  06/28/20 199 lb 6.4 oz (90.4 kg)   Gen: No distress   Neck: No JVD Ears: No Homero Fellers Sign Cardiac: No Rubs or Gallops,  Murmur, regular bradycardia, +2 radial pulses Respiratory: Clear to auscultation bilaterally, normal effort, normal  respiratory rate GI: Soft, nontender, non-distended  MS: No  edema;  moves all extremities Integument: Skin feels warm Neuro:  At time of evaluation, alert and oriented to person/place/time/situation  Psych: Normal affect, patient feels ok   ASSESSMENT:    1. Hyperlipidemia associated with type 2 diabetes mellitus (HCC)   2. Bradycardia   3. Elevated blood pressure reading   4. 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  Concerns regarding medicines are outlined above.  No orders of the defined types were placed in this encounter.  No orders of the  defined types were placed in this encounter.   There are no Patient Instructions on file for this visit.   Signed, Christell Constant, MD  04/10/2021 9:51 AM    [facility]

## 2021-04-10 ENCOUNTER — Encounter: Payer: Self-pay | Admitting: Internal Medicine

## 2021-04-10 ENCOUNTER — Other Ambulatory Visit: Payer: Self-pay

## 2021-04-10 ENCOUNTER — Ambulatory Visit: Payer: PPO | Admitting: Internal Medicine

## 2021-04-10 VITALS — BP 130/60 | HR 41 | Ht 70.5 in | Wt 204.0 lb

## 2021-04-10 DIAGNOSIS — E1169 Type 2 diabetes mellitus with other specified complication: Secondary | ICD-10-CM

## 2021-04-10 DIAGNOSIS — R030 Elevated blood-pressure reading, without diagnosis of hypertension: Secondary | ICD-10-CM | POA: Diagnosis not present

## 2021-04-10 DIAGNOSIS — R001 Bradycardia, unspecified: Secondary | ICD-10-CM | POA: Diagnosis not present

## 2021-04-10 DIAGNOSIS — I440 Atrioventricular block, first degree: Secondary | ICD-10-CM

## 2021-04-10 DIAGNOSIS — E785 Hyperlipidemia, unspecified: Secondary | ICD-10-CM

## 2021-04-10 NOTE — Patient Instructions (Signed)
Medication Instructions:  ?Your physician recommends that you continue on your current medications as directed. Please refer to the Current Medication list given to you today. ? ?*If you need a refill on your cardiac medications before your next appointment, please call your pharmacy* ? ? ?Lab Work: ?NONE ?If you have labs (blood work) drawn today and your tests are completely normal, you will receive your results only by: ?MyChart Message (if you have MyChart) OR ?A paper copy in the mail ?If you have any lab test that is abnormal or we need to change your treatment, we will call you to review the results. ? ? ?Testing/Procedures: ?NONE ? ? ?Follow-Up: ?At [facility], you and your health needs are our priority.  As part of our continuing mission to provide you with exceptional heart care, we have created designated Provider Care Teams.  These Care Teams include your primary Cardiologist (physician) and Advanced Practice Providers (APPs -  Physician Assistants and Nurse Practitioners) who all work together to provide you with the care you need, when you need it. ? ? ?Your next appointment:   ?12 month(s) ? ?The format for your next appointment:   ?In Person ? ?Provider:   ?Mahesh A Chandrasekhar, MD   ? ?

## 2021-05-22 DIAGNOSIS — N1831 Chronic kidney disease, stage 3a: Secondary | ICD-10-CM | POA: Diagnosis not present

## 2021-05-22 DIAGNOSIS — E7800 Pure hypercholesterolemia, unspecified: Secondary | ICD-10-CM | POA: Diagnosis not present

## 2021-05-22 DIAGNOSIS — Z23 Encounter for immunization: Secondary | ICD-10-CM | POA: Diagnosis not present

## 2021-05-22 DIAGNOSIS — E1122 Type 2 diabetes mellitus with diabetic chronic kidney disease: Secondary | ICD-10-CM | POA: Diagnosis not present

## 2021-05-22 DIAGNOSIS — Z1389 Encounter for screening for other disorder: Secondary | ICD-10-CM | POA: Diagnosis not present

## 2021-05-22 DIAGNOSIS — Z0000 Encounter for general adult medical examination without abnormal findings: Secondary | ICD-10-CM | POA: Diagnosis not present

## 2021-10-29 ENCOUNTER — Encounter (HOSPITAL_BASED_OUTPATIENT_CLINIC_OR_DEPARTMENT_OTHER): Payer: Self-pay

## 2021-10-29 ENCOUNTER — Emergency Department (HOSPITAL_BASED_OUTPATIENT_CLINIC_OR_DEPARTMENT_OTHER)
Admission: EM | Admit: 2021-10-29 | Discharge: 2021-10-29 | Disposition: A | Discharge status: Home / Self Care | Payer: PPO | Attending: Emergency Medicine | Admitting: Emergency Medicine

## 2021-10-29 ENCOUNTER — Other Ambulatory Visit: Payer: Self-pay

## 2021-10-29 DIAGNOSIS — R21 Rash and other nonspecific skin eruption: Secondary | ICD-10-CM | POA: Diagnosis present

## 2021-10-29 DIAGNOSIS — T7840XA Allergy, unspecified, initial encounter: Secondary | ICD-10-CM | POA: Diagnosis not present

## 2021-10-29 MED ORDER — DIPHENHYDRAMINE HCL 50 MG/ML IJ SOLN
25.0000 mg | Freq: Once | INTRAMUSCULAR | Status: AC
  Administered 2021-10-29: 25 mg via INTRAVENOUS
  Filled 2021-10-29: qty 1

## 2021-10-29 MED ORDER — METHYLPREDNISOLONE SODIUM SUCC 125 MG IJ SOLR
125.0000 mg | Freq: Once | INTRAMUSCULAR | Status: AC
  Administered 2021-10-29: 125 mg via INTRAVENOUS
  Filled 2021-10-29: qty 2

## 2021-10-29 MED ORDER — EPINEPHRINE 0.3 MG/0.3ML IJ SOAJ: 0.3000 mg | INTRAMUSCULAR | 1 refills | Status: AC | PRN

## 2021-10-29 MED ORDER — SODIUM CHLORIDE 0.9 % IV BOLUS
1000.0000 mL | Freq: Once | INTRAVENOUS | Status: AC
  Administered 2021-10-29: 1000 mL via INTRAVENOUS

## 2021-10-29 MED ORDER — DIPHENHYDRAMINE HCL 25 MG PO TABS: 25.0000 mg | ORAL_TABLET | Freq: Four times a day (QID) | ORAL | 0 refills | Status: AC

## 2021-10-29 MED ORDER — FAMOTIDINE IN NACL 20-0.9 MG/50ML-% IV SOLN
20.0000 mg | Freq: Once | INTRAVENOUS | Status: AC
  Administered 2021-10-29: 20 mg via INTRAVENOUS
  Filled 2021-10-29: qty 50

## 2021-10-29 MED ORDER — PREDNISONE 20 MG PO TABS: 40.0000 mg | ORAL_TABLET | Freq: Every day | ORAL | 0 refills | Status: AC

## 2021-10-29 MED ORDER — FAMOTIDINE 20 MG PO TABS: 20.0000 mg | ORAL_TABLET | Freq: Two times a day (BID) | ORAL | 0 refills | Status: AC

## 2021-10-29 NOTE — Discharge Instructions (Signed)
You were evaluated in the Emergency Department and after careful evaluation, we did not find any emergent condition requiring admission or further testing in the hospital.  Your exam/testing today is overall reassuring.  Symptoms seem to be due to an allergic reaction.  Start taking the Benadryl and Pepcid today as needed for itching or rash.  Tomorrow recommend starting the prednisone prescription daily as directed.  Only use the EpiPen if your symptoms significantly worsen and you have severe facial swelling or trouble breathing.  Recommend follow-up with your primary care doctor or an allergist.  Please return to the Emergency Department if you experience any worsening of your condition.   Thank you for allowing [place] to be a part of your care.

## 2021-10-29 NOTE — ED Triage Notes (Signed)
Patient states he was bit by something on his right upper thigh x 1 hr ago - Patient started noticing right sided facial swelling (noted in triage) Patient is speaking in full sentences. Airway patent. Patient states he feels like it is going to his tongue.

## 2021-10-29 NOTE — ED Provider Notes (Signed)
[facility]ENCY [facility]PT [facility] Emergency Department Provider Note MRN:  [id number]  Arrival date & time: 10/29/21     Chief Complaint   Insect Bite and Facial Swelling   History of Present Illness   Andrew Craig is a 71 y.o. year-old male with no pertinent past medical history presenting to the ED with chief complaint of insect bite.  Patient sustained a bite from some type of insect about an hour ago, now experiencing rash, swelling to the area as well as to the arms and legs, as well as the right lower lip.  Tongue feels weird but does not feel swollen, no shortness of breath, no other complaints.  Review of Systems  A thorough review of systems was obtained and all systems are negative except as noted in the HPI and PMH.   Patient's Health History    Past Medical History:  Diagnosis Date   Hand pain    Hypercholesterolemia    Hyperlipemia    Pneumonia     Past Surgical History:  Procedure Laterality Date   TONSILLECTOMY      No family history on file.  Social History   Socioeconomic History   Marital status: Married    Spouse name: Not on file   Number of children: Not on file   Years of education: Not on file   Highest education level: Not on file  Occupational History   Not on file  Tobacco Use   Smoking status: Never   Smokeless tobacco: Never  Vaping Use   Vaping Use: Never used  Substance and Sexual Activity   Alcohol use: Yes    Comment: occ   Drug use: No   Sexual activity: Not on file  Other Topics Concern   Not on file  Social History Narrative   Not on file   Social Determinants of Health   Financial Resource Strain: Not on file  Food Insecurity: Not on file  Transportation Needs: Not on file  Physical Activity: Not on file  Stress: Not on file  Social Connections: Not on file  Intimate Partner Violence: Not on file     Physical Exam   Vitals:   10/29/21 1245 10/29/21 1330  BP: 126/80 (!) 145/55  Pulse: (!) 42 (!) 32   Resp: 18 (!) 26  Temp:    SpO2: 98% 100%    CONSTITUTIONAL: Well-appearing, NAD NEURO/PSYCH:  Alert and oriented x 3, no focal deficits EYES:  eyes equal and reactive ENT/NECK:  no LAD, no JVD CARDIO: Regular rate, well-perfused, normal S1 and S2 PULM:  CTAB no wheezing or rhonchi GI/GU:  non-distended, non-tender MSK/SPINE:  No gross deformities, no edema SKIN:  no rash, atraumatic   *Additional and/or pertinent findings included in MDM below  Diagnostic and Interventional Summary    EKG Interpretation  Date/Time:    Ventricular Rate:    PR Interval:    QRS Duration:   QT Interval:    QTC Calculation:   R Axis:     Text Interpretation:         Labs Reviewed - No data to display  No orders to display    Medications  diphenhydrAMINE (BENADRYL) injection 25 mg (25 mg Intravenous Given 10/29/21 1156)  methylPREDNISolone sodium succinate (SOLU-MEDROL) 125 mg/2 mL injection 125 mg (125 mg Intravenous Given 10/29/21 1157)  famotidine (PEPCID) IVPB 20 mg premix (0 mg Intravenous Stopped 10/29/21 1332)  sodium chloride 0.9 % bolus 1,000 mL (0 mLs Intravenous Stopped 10/29/21 1332)  Procedures  /  Critical Care Procedures  ED Course and Medical Decision Making  Initial Impression and Ddx Reaction, patient thinks it was a black ant that bit him.  Did not see any spiders.  Hives, diffuse redness to the area, mild lip swelling on the right lower.  No immediate airway concerns, well-appearing, no acute distress, providing steroids, antihistamines, will reassess  Past medical/surgical history that increases complexity of ED encounter: None  Interpretation of Diagnostics Laboratory and/or imaging options to aid in the diagnosis/care of the patient were considered.  After careful history and physical examination, it was determined that there was no indication for diagnostics at this time.  Patient Reassessment and Ultimate Disposition/Management     On reassessment patient  seems to be doing much better, lip swelling largely resolved, no acute distress, normal vital signs with the exception of his baseline bradycardia.  Well-perfused.  Appropriate for discharge.  Patient management required discussion with the following services or consulting groups:  None  Complexity of Problems Addressed Acute illness or injury that poses threat of life of bodily function  Additional Data Reviewed and Analyzed Further history obtained from: None  Additional Factors Impacting ED Encounter Risk Consideration of hospitalization  Elmer Sow. Pilar Plate, MD [facility] Emergency Medicine [facility] [email or web address]  Final Clinical Impressions(s) / ED Diagnoses     ICD-10-CM   1. Allergic reaction, initial encounter  T78.40XA       ED Discharge Orders          Ordered    EPINEPHrine 0.3 mg/0.3 mL IJ SOAJ injection  As needed        10/29/21 1340    diphenhydrAMINE (BENADRYL) 25 MG tablet  Every 6 hours        10/29/21 1340    famotidine (PEPCID) 20 MG tablet  2 times daily        10/29/21 1340    predniSONE (DELTASONE) 20 MG tablet  Daily        10/29/21 1340             Discharge Instructions Discussed with and Provided to Patient:     Discharge Instructions      You were evaluated in the Emergency Department and after careful evaluation, we did not find any emergent condition requiring admission or further testing in the hospital.  Your exam/testing today is overall reassuring.  Symptoms seem to be due to an allergic reaction.  Start taking the Benadryl and Pepcid today as needed for itching or rash.  Tomorrow recommend starting the prednisone prescription daily as directed.  Only use the EpiPen if your symptoms significantly worsen and you have severe facial swelling or trouble breathing.  Recommend follow-up with your primary care doctor or an allergist.  Please return to the Emergency Department if you experience any worsening of your  condition.   Thank you for allowing [place] to be a part of your care.       Sabas Sous, MD 10/29/21 1343

## 2021-10-29 NOTE — ED Notes (Signed)
Reviewed discharge instructions and recommendations with patient. States understanding. Ambulatory upon discharge 

## 2021-11-18 DIAGNOSIS — R300 Dysuria: Secondary | ICD-10-CM | POA: Diagnosis not present

## 2021-11-18 DIAGNOSIS — E1122 Type 2 diabetes mellitus with diabetic chronic kidney disease: Secondary | ICD-10-CM | POA: Diagnosis not present

## 2021-11-18 DIAGNOSIS — E7800 Pure hypercholesterolemia, unspecified: Secondary | ICD-10-CM | POA: Diagnosis not present

## 2021-11-18 DIAGNOSIS — N1831 Chronic kidney disease, stage 3a: Secondary | ICD-10-CM | POA: Diagnosis not present

## 2021-11-18 DIAGNOSIS — N485 Ulcer of penis: Secondary | ICD-10-CM | POA: Diagnosis not present

## 2021-11-25 DIAGNOSIS — N485 Ulcer of penis: Secondary | ICD-10-CM | POA: Diagnosis not present

## 2022-03-20 IMAGING — DX DG CHEST 1V PORT
1 series · 1 of 1 positions shown · non-contrast
Comparison: None.

CLINICAL DATA: Cough and fever.

EXAM:
PORTABLE CHEST 1 VIEW

[chest ap]
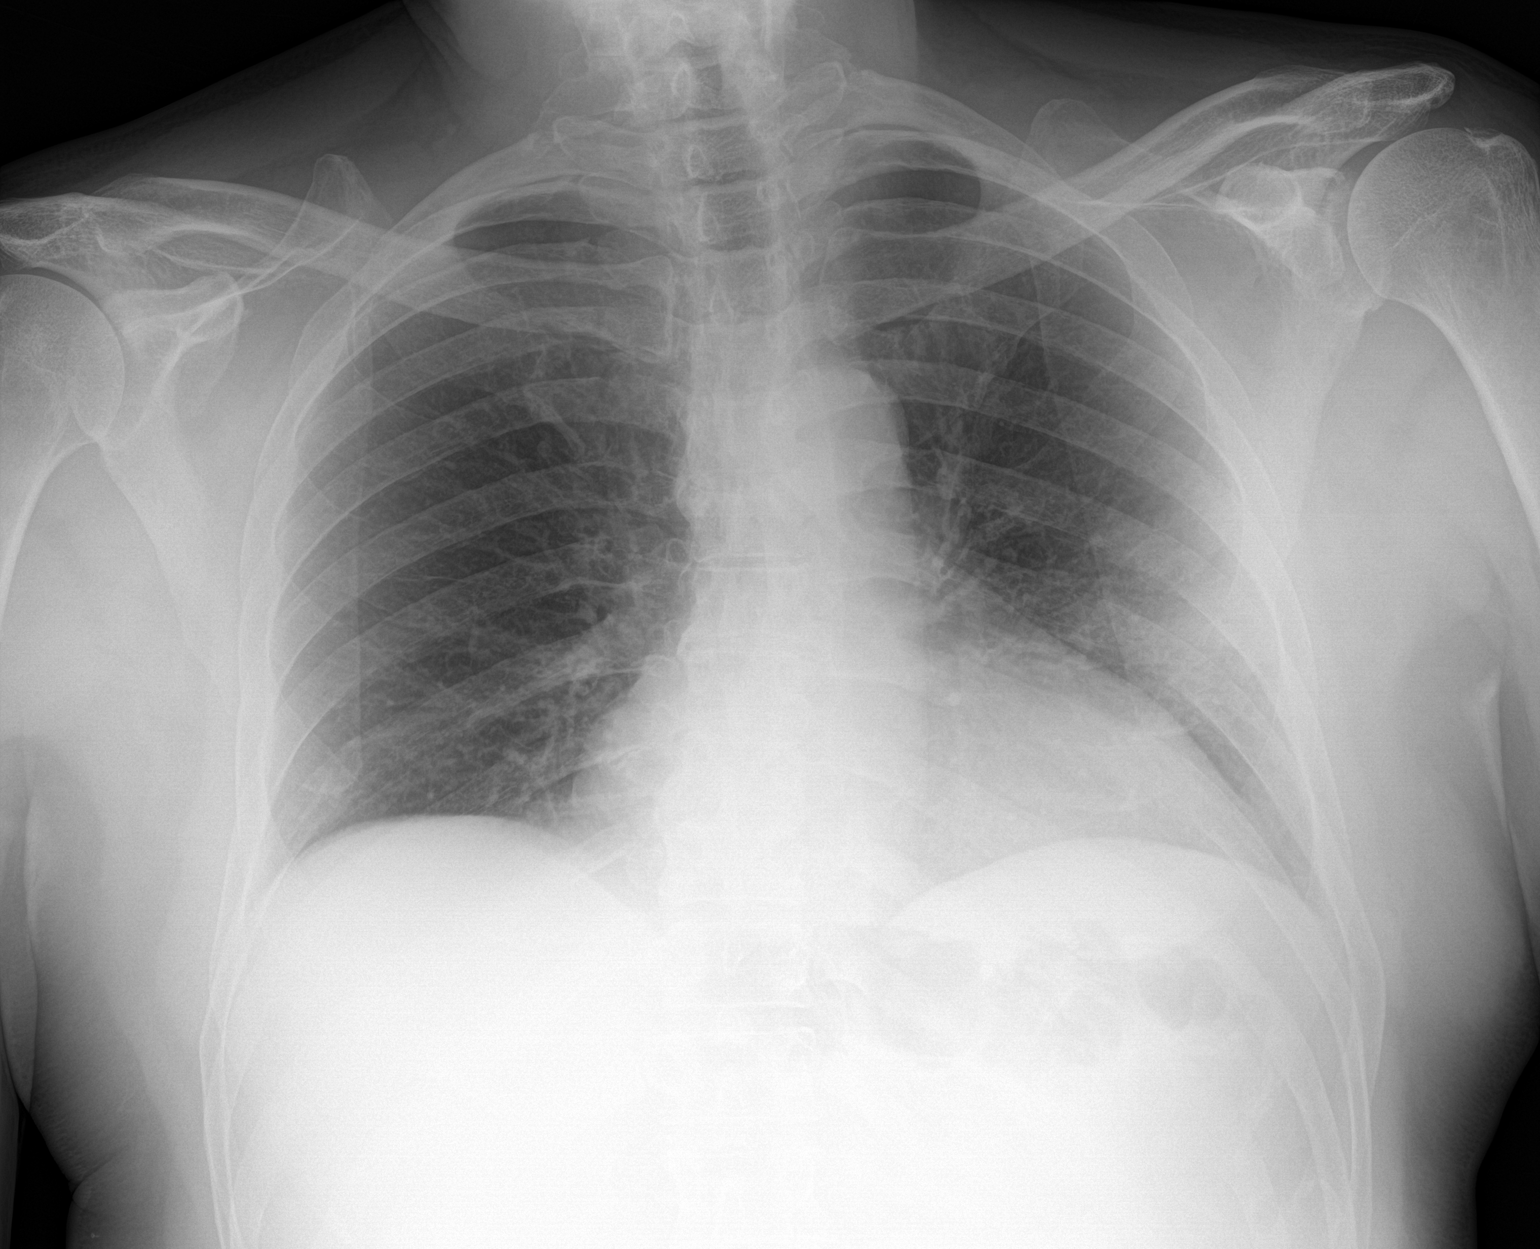

[1 of 1 positions shown; findings below may reference images not displayed]

FINDINGS: Low lung volumes. Upper normal heart size likely accentuated by
technique. Patchy opacity in the periphery of the left mid lung with
additional vague bibasilar opacities. No pleural effusion,
pneumothorax or pulmonary edema. No acute osseous abnormalities are
seen.
IMPRESSION: Patchy opacity in the periphery of the left mid lung and additional
vague bibasilar opacities, suspicious for pneumonia in the setting
of cough and fever. Findings can be seen in the setting of COVID 19.

## 2022-05-26 ENCOUNTER — Encounter: Payer: Self-pay | Admitting: Internal Medicine

## 2022-05-26 ENCOUNTER — Ambulatory Visit: Payer: Medicare HMO | Attending: Internal Medicine | Admitting: Internal Medicine

## 2022-05-26 VITALS — BP 130/76 | HR 58 | Ht 70.5 in | Wt 196.0 lb

## 2022-05-26 DIAGNOSIS — R001 Bradycardia, unspecified: Secondary | ICD-10-CM

## 2022-05-26 DIAGNOSIS — I440 Atrioventricular block, first degree: Secondary | ICD-10-CM | POA: Diagnosis not present

## 2022-05-26 DIAGNOSIS — E1169 Type 2 diabetes mellitus with other specified complication: Secondary | ICD-10-CM | POA: Diagnosis not present

## 2022-05-26 DIAGNOSIS — E785 Hyperlipidemia, unspecified: Secondary | ICD-10-CM

## 2022-05-26 NOTE — Progress Notes (Signed)
Cardiology Office Note:    Date:  05/26/2022   ID:  Andrew Craig, DOB 09-Oct-1950, MRN [id number]  PCP:  Donald Prose, MD  [facility] Cardiologist:  Rudean Haskell MD [place] Electrophysiologist:  Vickie Epley, MD   CC: Chest pain f/u  History of Present Illness:    Andrew Craig is a 72 y.o. male with a hx of history of Bradycardia, DM with HLD, CKD Stage IIIa who presented for evaluation 05/10/20.  2022: In interim of this visit, patient no evidence of blockages or chronotropic incompetence on exercise NM Stress. Had notable hypertensive response.  Saw EP for pauses with no plans for PPM.   Patient notes that he is doing well.   Still working.  Had an allergic reaction to ants 10/2021.  No other ED visits.  No chest pain or pressure .  No SOB/DOE and no PND/Orthopnea.  No weight gain or leg swelling.  No palpitations or syncope.  His watch shows slow heart rates at rest but not with activity.  No near syncope.    Past Medical History:  Diagnosis Date   Hand pain    Hypercholesterolemia    Hyperlipemia    Pneumonia     Past Surgical History:  Procedure Laterality Date   TONSILLECTOMY      Current Medications: Current Meds  Medication Sig   aspirin 81 MG chewable tablet Chew by mouth daily.   atorvastatin (LIPITOR) 40 MG tablet Take 40 mg by mouth daily.   cholecalciferol (VITAMIN D3) 25 MCG (1000 UNIT) tablet Take 1,000 Units by mouth daily.   diphenhydrAMINE (BENADRYL) 25 MG tablet Take 1 tablet (25 mg total) by mouth every 6 (six) hours. (Patient taking differently: Take 25 mg by mouth as needed for allergies.)   EPINEPHrine 0.3 mg/0.3 mL IJ SOAJ injection Inject 0.3 mg into the muscle as needed for anaphylaxis.   famotidine (PEPCID) 20 MG tablet Take 1 tablet (20 mg total) by mouth 2 (two) times daily.   zinc gluconate 50 MG tablet Take 50 mg by mouth daily.     Allergies:   Aspirin and Ibuprofen   Social History   Socioeconomic History    Marital status: Married    Spouse name: Not on file   Number of children: Not on file   Years of education: Not on file   Highest education level: Not on file  Occupational History   Not on file  Tobacco Use   Smoking status: Never   Smokeless tobacco: Never  Vaping Use   Vaping Use: Never used  Substance and Sexual Activity   Alcohol use: Yes    Comment: occ   Drug use: No   Sexual activity: Not on file  Other Topics Concern   Not on file  Social History Narrative   Not on file   Social Determinants of Health   Financial Resource Strain: Not on file  Food Insecurity: Not on file  Transportation Needs: Not on file  Physical Activity: Not on file  Stress: Not on file  Social Connections: Not on file    Social: still working, has his own janitorial service, 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 History of heart failure notable for no members. History of arrhythmia notable for no members.  ROS:   Please see the history of present illness.    All other systems reviewed and are negative.  EKGs/Labs/Other Studies Reviewed:    The following studies  were reviewed today:  EKG:   05/26/22: Sinus brady 1st HB rare PACs 04/10/21:  Marked sinus bradycardia 1st HB Rare junctional beat 06/28/20: Marked Sinus Bradycardia 1st HB 05/10/20: Marked Sinus Bradycardia rate 35 with 1st HB; Borderline Anterior Infarct Pattern 12/29/19: Likely Sinus Morphology, Bradycardia with rate of 43  Cardiac Event Monitoring: Date: 07/23/20 Results: Patient had a minimum heart rate of 24 bpm, maximum heart rate of 169 bpm, and average heart rate of 48 bpm. Predominant underlying rhythm was sinus rhythm. Eleven runs of supraventricular tachycardia occurred lasting 11 beats at longest with a max rate of 169 bpm at fastest. Isolated PACs were rare (<1.0%), with rare couplets and triplets present. Isolated PVCs were rare (<1.0%), with rare  couplets present. First heart AV block. Multiple episodes of both vasotonic sinus pauses, heart rates 30 bpm (predominantly nocturnal), with pauses occurring during the waking hours (7:31 PM). Triggered and diary events associated with sinus rhythm.   Marked sinus bradycardia with nocturnal pauses.   NM Stress Testing : Date: 06/28/20 Results: Nuclear stress EF: 64%. There was no ST segment deviation noted during stress. No T wave inversion was noted during stress. The study is normal. This is a low risk study. The left ventricular ejection fraction is normal (55-65%).    Recent Labs: No results found for requested labs within last 365 days.  Recent Lipid Panel No results found for: "CHOL", "TRIG", "HDL", "CHOLHDL", "VLDL", "LDLCALC", "LDLDIRECT"   Physical Exam:    VS:  BP 130/76   Pulse (!) 58   Ht 5' 10.5" (1.791 m)   Wt 196 lb (88.9 kg)   SpO2 98%   BMI 27.73 kg/m     Wt Readings from Last 3 Encounters:  05/26/22 196 lb (88.9 kg)  10/29/21 190 lb (86.2 kg)  04/10/21 204 lb (92.5 kg)   Gen: No distress   Neck: No JVD Ears: No Pilar Plate Sign Cardiac: No Rubs or Gallops,  no murmur, regular bradycardia, +2 radial pulses Respiratory: Clear to auscultation bilaterally, normal effort, normal  respiratory rate GI: Soft, nontender, non-distended  MS: No  edema;  moves all extremities Integument: Skin feels warm Neuro:  At time of evaluation, alert and oriented to person/place/time/situation  Psych: Normal affect, patient feels ok   ASSESSMENT:    1. Hyperlipidemia associated with type 2 diabetes mellitus ([place])   2. Heart block AV first degree   3. 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  Concerns regarding medicines are outlined above.  Orders Placed This Encounter  Procedures   Lipid panel   ALT   EKG 12-Lead    No orders of the defined types were placed in this encounter.    Patient Instructions  Medication Instructions:  Your physician recommends that you continue on your current medications as directed. Please refer to the Current Medication list given to you today.  *  If you need a refill on your cardiac medications before your next appointment, please call your pharmacy*   Lab Work: IN 2 MONTHS: FLP, ALT  If you have labs (blood work) drawn today and your tests are completely normal, you will receive your results only by: MyChart Message (if you have MyChart) OR A paper copy in the mail If you have any lab test that is abnormal or we need to change your treatment, we will call you to review the results.   Testing/Procedures: NONE   Follow-Up: At [facility], you and your health needs are our priority.  As part of our continuing mission to provide you with exceptional heart care, we have created designated Provider Care Teams.  These Care Teams include your primary Cardiologist (physician) and Advanced Practice Providers (APPs -  Physician Assistants and Nurse Practitioners) who all work together to provide you with the care you need, when you need it.  We recommend signing up for the patient portal called "MyChart".  Sign up information is provided on this After Visit Summary.  MyChart is used to connect with patients for Virtual Visits (Telemedicine).  Patients are able to view lab/test results, encounter notes, upcoming appointments, etc.  Non-urgent messages can  be sent to your provider as well.   To learn more about what you can do with MyChart, go to [email or web address].    Your next appointment:   1 year(s)  Provider:   Christell Constant, MD       Signed, Christell Constant, MD  05/26/2022 2:38 PM    [facility]

## 2022-05-26 NOTE — Patient Instructions (Signed)
Medication Instructions:  Your physician recommends that you continue on your current medications as directed. Please refer to the Current Medication list given to you today.  *If you need a refill on your cardiac medications before your next appointment, please call your pharmacy*   Lab Work: IN 2 MONTHS: FLP, ALT  If you have labs (blood work) drawn today and your tests are completely normal, you will receive your results only by: [place] (if you have MyChart) OR A paper copy in the mail If you have any lab test that is abnormal or we need to change your treatment, we will call you to review the results.   Testing/Procedures: NONE   Follow-Up: At [facility], you and your health needs are our priority.  As part of our continuing mission to provide you with exceptional heart care, we have created designated Provider Care Teams.  These Care Teams include your primary Cardiologist (physician) and Advanced Practice Providers (APPs -  Physician Assistants and Nurse Practitioners) who all work together to provide you with the care you need, when you need it.  We recommend signing up for the patient portal called "MyChart".  Sign up information is provided on this After Visit Summary.  MyChart is used to connect with patients for Virtual Visits (Telemedicine).  Patients are able to view lab/test results, encounter notes, upcoming appointments, etc.  Non-urgent messages can be sent to your provider as well.   To learn more about what you can do with MyChart, go to [email or web address].    Your next appointment:   1 year(s)  Provider:   Werner Lean, MD

## 2022-06-09 DIAGNOSIS — Z0000 Encounter for general adult medical examination without abnormal findings: Secondary | ICD-10-CM | POA: Diagnosis not present

## 2022-06-09 DIAGNOSIS — E1136 Type 2 diabetes mellitus with diabetic cataract: Secondary | ICD-10-CM | POA: Diagnosis not present

## 2022-06-09 DIAGNOSIS — N1831 Chronic kidney disease, stage 3a: Secondary | ICD-10-CM | POA: Diagnosis not present

## 2022-06-09 DIAGNOSIS — E7800 Pure hypercholesterolemia, unspecified: Secondary | ICD-10-CM | POA: Diagnosis not present

## 2022-06-09 DIAGNOSIS — Z1331 Encounter for screening for depression: Secondary | ICD-10-CM | POA: Diagnosis not present

## 2022-06-09 DIAGNOSIS — H2513 Age-related nuclear cataract, bilateral: Secondary | ICD-10-CM | POA: Diagnosis not present

## 2022-06-09 DIAGNOSIS — R001 Bradycardia, unspecified: Secondary | ICD-10-CM | POA: Diagnosis not present

## 2022-06-09 DIAGNOSIS — E1122 Type 2 diabetes mellitus with diabetic chronic kidney disease: Secondary | ICD-10-CM | POA: Diagnosis not present

## 2022-06-09 DIAGNOSIS — Z23 Encounter for immunization: Secondary | ICD-10-CM | POA: Diagnosis not present

## 2022-06-09 DIAGNOSIS — Z125 Encounter for screening for malignant neoplasm of prostate: Secondary | ICD-10-CM | POA: Diagnosis not present

## 2022-06-12 ENCOUNTER — Ambulatory Visit: Payer: PPO | Admitting: Internal Medicine

## 2022-07-10 DIAGNOSIS — E119 Type 2 diabetes mellitus without complications: Secondary | ICD-10-CM | POA: Diagnosis not present

## 2022-07-10 DIAGNOSIS — H40023 Open angle with borderline findings, high risk, bilateral: Secondary | ICD-10-CM | POA: Diagnosis not present

## 2022-07-10 DIAGNOSIS — H524 Presbyopia: Secondary | ICD-10-CM | POA: Diagnosis not present

## 2022-07-14 DIAGNOSIS — R972 Elevated prostate specific antigen [PSA]: Secondary | ICD-10-CM | POA: Diagnosis not present

## 2022-07-19 DIAGNOSIS — H524 Presbyopia: Secondary | ICD-10-CM | POA: Diagnosis not present

## 2022-07-19 DIAGNOSIS — H52223 Regular astigmatism, bilateral: Secondary | ICD-10-CM | POA: Diagnosis not present

## 2022-07-24 ENCOUNTER — Ambulatory Visit: Payer: Medicare HMO | Attending: Internal Medicine

## 2022-07-24 DIAGNOSIS — E1169 Type 2 diabetes mellitus with other specified complication: Secondary | ICD-10-CM | POA: Diagnosis not present

## 2022-07-24 DIAGNOSIS — E785 Hyperlipidemia, unspecified: Secondary | ICD-10-CM | POA: Diagnosis not present

## 2022-07-25 LAB — LIPID PANEL
Chol/HDL Ratio: 3.2 ratio (ref 0.0–5.0)
Cholesterol, Total: 139 mg/dL (ref 100–199)
HDL: 44 mg/dL (ref >39)
LDL Chol Calc (NIH): 79 mg/dL (ref 0–99)
Triglycerides: 80 mg/dL (ref 0–149)
VLDL Cholesterol Cal: 16 mg/dL (ref 5–40)

## 2022-07-25 LAB — ALT: ALT: 29 IU/L (ref 0–44)

## 2022-07-29 ENCOUNTER — Telehealth: Payer: Self-pay

## 2022-07-29 DIAGNOSIS — E1169 Type 2 diabetes mellitus with other specified complication: Secondary | ICD-10-CM

## 2022-07-29 NOTE — Telephone Encounter (Signed)
-----   Message from Werner Lean, MD sent at 07/27/2022  9:02 PM EDT ----- Results: LDL above goal Plan: Repeat labs in six months  Werner Lean, MD

## 2022-07-29 NOTE — Telephone Encounter (Signed)
The patient has been notified of the result and verbalized understanding.  All questions (if any) were answered. Precious Gilding, RN 07/29/2022 12:48 PM   Pt will come in on 01/29/23 for [place] appointment reminder sent out through the mail.

## 2022-12-08 DIAGNOSIS — E1122 Type 2 diabetes mellitus with diabetic chronic kidney disease: Secondary | ICD-10-CM | POA: Diagnosis not present

## 2022-12-08 DIAGNOSIS — N1831 Chronic kidney disease, stage 3a: Secondary | ICD-10-CM | POA: Diagnosis not present

## 2022-12-08 DIAGNOSIS — E7800 Pure hypercholesterolemia, unspecified: Secondary | ICD-10-CM | POA: Diagnosis not present

## 2022-12-23 ENCOUNTER — Other Ambulatory Visit: Payer: Self-pay

## 2022-12-23 ENCOUNTER — Emergency Department (HOSPITAL_BASED_OUTPATIENT_CLINIC_OR_DEPARTMENT_OTHER)
Admission: EM | Admit: 2022-12-23 | Discharge: 2022-12-23 | Disposition: A | Discharge status: Home / Self Care | Payer: Medicare HMO | Attending: Emergency Medicine | Admitting: Emergency Medicine

## 2022-12-23 ENCOUNTER — Other Ambulatory Visit (HOSPITAL_BASED_OUTPATIENT_CLINIC_OR_DEPARTMENT_OTHER): Payer: Self-pay

## 2022-12-23 ENCOUNTER — Encounter (HOSPITAL_BASED_OUTPATIENT_CLINIC_OR_DEPARTMENT_OTHER): Payer: Self-pay | Admitting: *Deleted

## 2022-12-23 DIAGNOSIS — M5412 Radiculopathy, cervical region: Secondary | ICD-10-CM | POA: Diagnosis not present

## 2022-12-23 DIAGNOSIS — M62838 Other muscle spasm: Secondary | ICD-10-CM | POA: Diagnosis not present

## 2022-12-23 DIAGNOSIS — N183 Chronic kidney disease, stage 3 unspecified: Secondary | ICD-10-CM | POA: Diagnosis not present

## 2022-12-23 DIAGNOSIS — Z7982 Long term (current) use of aspirin: Secondary | ICD-10-CM | POA: Insufficient documentation

## 2022-12-23 DIAGNOSIS — M542 Cervicalgia: Secondary | ICD-10-CM | POA: Diagnosis not present

## 2022-12-23 DIAGNOSIS — E119 Type 2 diabetes mellitus without complications: Secondary | ICD-10-CM | POA: Insufficient documentation

## 2022-12-23 HISTORY — DX: Bradycardia, unspecified: R00.1

## 2022-12-23 MED ORDER — LIDOCAINE 5 % EX PTCH
1.0000 | MEDICATED_PATCH | CUTANEOUS | 0 refills | Status: AC
  Filled 2022-12-23: qty 30, 30d supply, fill #0

## 2022-12-23 MED ORDER — CYCLOBENZAPRINE HCL 10 MG PO TABS
10.0000 mg | ORAL_TABLET | Freq: Two times a day (BID) | ORAL | 0 refills | Status: AC | PRN
  Filled 2022-12-23: qty 20, 10d supply, fill #0

## 2022-12-23 NOTE — ED Triage Notes (Signed)
Here by POV from home for stiff neck, increased discomfort/ pain with movement only. Denies known injury, but was moving things 2d ago. Sx onset yesterday. Denies sx other than stiffness and discomfort. Denies fever, HA, NVD, visual changes, numbness, tingling, syncope, dizziness, weakness. Applied icy hot patch PTA. Tylenol at 0730 w/o change. Reports his usual HR is 30s-50s.

## 2022-12-23 NOTE — ED Provider Notes (Signed)
[place] EMERG[facility]Y DEPARTMENT AT [facility] Provider Note   CSN: [id number] Arrival date & time: 12/23/22  4098     History  Chief Complaint  Patient presents with   Torticollis    Andrew Craig is a 72 y.o. male.  HPI     72 year old male with a history of diabetes, hyperlipidemia, bradycardia, CKD stage III who presents with neck pain.   Reports that he is here for a stiff neck, with increased pain with movement.  Denies any known injury, but was moving things 2 days ago.  Denies fever, headache, nausea, vomiting, diarrhea, visual changes, numbness, tingling, weakness, syncope, dizziness.  He placed an IcyHot patch to the area prior to arrival and took Tylenol.  Reports he has chronic bradycardia.  Pain severe with any movement of the head to each side and difficulty turning head due to pain and spasm.  No chest pain or dyspnea.   Past Medical History:  Diagnosis Date   Bradycardia    Hand pain    Hypercholesterolemia    Hyperlipemia    Pneumonia      Home Medications Prior to Admission medications   Medication Sig Start Date End Date Taking? Authorizing Provider  cyclobenzaprine (FLEXERIL) 10 MG tablet Take 1 tablet (10 mg total) by mouth 2 (two) times daily as needed for muscle spasms. 12/23/22  Yes Alvira Monday, MD  lidocaine (LIDODERM) 5 % Place 1 patch onto the skin daily. Remove & Discard patch within 12 hours or as directed by MD. 12/23/22  Yes Alvira Monday, MD  aspirin 81 MG chewable tablet Chew by mouth daily.    [provider]  atorvastatin (LIPITOR) 40 MG tablet Take 40 mg by mouth daily.    [provider]  cholecalciferol (VITAMIN D3) 25 MCG (1000 UNIT) tablet Take 1,000 Units by mouth daily.    [provider]  diphenhydrAMINE (BENADRYL) 25 MG tablet Take 1 tablet (25 mg total) by mouth every 6 (six) hours. Patient taking differently: Take 25 mg by mouth as needed for allergies. 10/29/21   Sabas Sous, MD   EPINEPHrine 0.3 mg/0.3 mL IJ SOAJ injection Inject 0.3 mg into the muscle as needed for anaphylaxis. 10/29/21   Sabas Sous, MD  famotidine (PEPCID) 20 MG tablet Take 1 tablet (20 mg total) by mouth 2 (two) times daily. 10/29/21   Sabas Sous, MD  zinc gluconate 50 MG tablet Take 50 mg by mouth daily.    [provider]      Allergies    Aspirin and Ibuprofen    Review of Systems   Review of Systems  Physical Exam Updated Vital Signs BP (!) 198/82 (BP Location: Right Arm)   Pulse (!) 36 Comment: usual per pt  Temp 98.2 F (36.8 C) (Oral)   Resp 18   Wt 88.9 kg   SpO2 100%   BMI 27.73 kg/m  Physical Exam Vitals and nursing note reviewed.  Constitutional:      General: He is not in acute distress.    Appearance: He is well-developed. He is not diaphoretic.  HENT:     Head: Normocephalic and atraumatic.  Eyes:     Conjunctiva/sclera: Conjunctivae normal.  Cardiovascular:     Rate and Rhythm: Normal rate and regular rhythm.     Heart sounds: Normal heart sounds. No murmur heard.    No friction rub. No gallop.  Pulmonary:     Effort: Pulmonary effort is normal. No respiratory distress.  Breath sounds: Normal breath sounds. No wheezing or rales.  Abdominal:     General: There is no distension.     Palpations: Abdomen is soft.     Tenderness: There is no abdominal tenderness. There is no guarding.  Musculoskeletal:        General: Tenderness (paraspinal tenderness and spasm) present.     Cervical back: Normal range of motion.  Skin:    General: Skin is warm and dry.  Neurological:     Mental Status: He is alert and oriented to person, place, and time.     Sensory: No sensory deficit (normal sensation).     Motor: No weakness (normal UE strength).     ED Results / Procedures / Treatments   Labs (all labs ordered are listed, but only abnormal results are displayed) Labs Reviewed - No data to display  EKG None  Radiology No results  found.  Procedures Procedures    Medications Ordered in ED Medications - No data to display  ED Course/ Medical Decision Making/ A&P                                  72 year old male with a history of diabetes, hyperlipidemia, bradycardia, CKD stage III who presents with neck pain.  Has significant spasm to paraspinal muscles, pain with any movements and pain with palpation. Normal pulses, normal strength/sensation, no cp/dyspnea and doubt ACS, dissection, CVA, acute cervical emergency.  Discussed possible disc herniation with spasm versus other cervical spasm.  Doubt acute fracture, epidural abscess.  Has chronic bradycardia.   Given rx for flexeril and lidoderm patch. Recommend PCP follow up. Patient discharged in stable condition with understanding of reasons to return.         Final Clinical Impression(s) / ED Diagnoses Final diagnoses:  Cervical paraspinal muscle spasm  Neck pain    Rx / DC Orders ED Discharge Orders          Ordered    cyclobenzaprine (FLEXERIL) 10 MG tablet  2 times daily PRN        12/23/22 1051    lidocaine (LIDODERM) 5 %  Every 24 hours        12/23/22 1051              Alvira Monday, MD 12/23/22 2313

## 2022-12-24 DIAGNOSIS — M62838 Other muscle spasm: Secondary | ICD-10-CM | POA: Diagnosis not present

## 2022-12-24 DIAGNOSIS — M542 Cervicalgia: Secondary | ICD-10-CM | POA: Diagnosis not present

## 2023-01-14 DIAGNOSIS — H401131 Primary open-angle glaucoma, bilateral, mild stage: Secondary | ICD-10-CM | POA: Diagnosis not present

## 2023-01-29 ENCOUNTER — Ambulatory Visit: Payer: Medicare HMO | Attending: Internal Medicine

## 2023-01-29 DIAGNOSIS — E1169 Type 2 diabetes mellitus with other specified complication: Secondary | ICD-10-CM

## 2023-01-29 DIAGNOSIS — E785 Hyperlipidemia, unspecified: Secondary | ICD-10-CM | POA: Diagnosis not present

## 2023-01-29 LAB — LIPID PANEL
Chol/HDL Ratio: 3.1 {ratio} (ref 0.0–5.0)
Cholesterol, Total: 157 mg/dL (ref 100–199)
HDL: 51 mg/dL (ref >39)
LDL Chol Calc (NIH): 88 mg/dL (ref 0–99)
Triglycerides: 96 mg/dL (ref 0–149)
VLDL Cholesterol Cal: 18 mg/dL (ref 5–40)

## 2023-02-02 ENCOUNTER — Telehealth: Payer: Self-pay | Admitting: Internal Medicine

## 2023-02-02 DIAGNOSIS — E785 Hyperlipidemia, unspecified: Secondary | ICD-10-CM

## 2023-02-02 MED ORDER — ATORVASTATIN CALCIUM 80 MG PO TABS: 80.0000 mg | ORAL_TABLET | Freq: Every day | ORAL | 3 refills | Status: AC

## 2023-02-02 NOTE — Telephone Encounter (Signed)
-----   Message from Christell Constant sent at 01/30/2023  2:23 PM EDT ----- Results: LDL is worsening despite dietary and lifestyle interventions Plan: Atorvastatin 80 and labs in three months  Christell Constant, MD

## 2023-02-02 NOTE — Telephone Encounter (Signed)
The patient has been notified of the result and verbalized understanding.  All questions (if any) were answered. Macie Burows, RN 02/02/2023 10:52 AM   Pt will have repeat labs at 05/27/23 OV with MD.  Algis Downs pt to fast prior to OV; pt expresses understanding.

## 2023-02-02 NOTE — Telephone Encounter (Signed)
Patient states he is returning call. Please advise.

## 2023-02-02 NOTE — Addendum Note (Signed)
Addended by: Macie Burows on: 02/02/2023 10:54 AM   Modules accepted: Orders

## 2023-05-27 ENCOUNTER — Ambulatory Visit: Payer: PPO | Attending: Internal Medicine | Admitting: Internal Medicine

## 2023-05-27 VITALS — BP 122/68 | HR 38 | Ht 70.5 in | Wt 195.0 lb

## 2023-05-27 DIAGNOSIS — E785 Hyperlipidemia, unspecified: Secondary | ICD-10-CM | POA: Diagnosis not present

## 2023-05-27 DIAGNOSIS — E119 Type 2 diabetes mellitus without complications: Secondary | ICD-10-CM | POA: Diagnosis not present

## 2023-05-27 DIAGNOSIS — I10 Essential (primary) hypertension: Secondary | ICD-10-CM | POA: Diagnosis not present

## 2023-05-27 DIAGNOSIS — I4589 Other specified conduction disorders: Secondary | ICD-10-CM

## 2023-05-27 DIAGNOSIS — E1169 Type 2 diabetes mellitus with other specified complication: Secondary | ICD-10-CM

## 2023-05-27 DIAGNOSIS — R001 Bradycardia, unspecified: Secondary | ICD-10-CM

## 2023-05-27 DIAGNOSIS — I440 Atrioventricular block, first degree: Secondary | ICD-10-CM | POA: Diagnosis not present

## 2023-05-27 LAB — LIPID PANEL
Chol/HDL Ratio: 3 {ratio} (ref 0.0–5.0)
Cholesterol, Total: 135 mg/dL (ref 100–199)
HDL: 45 mg/dL (ref >39)
LDL Chol Calc (NIH): 72 mg/dL (ref 0–99)
Triglycerides: 93 mg/dL (ref 0–149)
VLDL Cholesterol Cal: 18 mg/dL (ref 5–40)

## 2023-05-27 NOTE — Progress Notes (Signed)
Cardiology Office Note:  .    Date:  05/27/2023  ID:  Andrew Craig, DOB 04-30-1950, MRN [id number] PCP: Deatra James, MD  [facility] Providers Cardiologist:  Christell Constant, MD Electrophysiologist:  Lanier Prude, MD     CC: Chronic Cardiac Comorbidity MGMT  History of Present Illness: .    Andrew Craig is a 73 y.o. 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 Had notable hypertensive response.  Saw EP for pauses with no plans for PPM.   Andrew Craig presents for follow-up of sinus bradycardia with first degree heart block, hyperlipidemia, and diabetes.  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 His heart rates are slow, particularly when asleep, but he remains asymptomatic. A stress test revealed chronotropic incompetence, but he has not experienced any symptoms such as chest pain or syncope. He was evaluated by an electrophysiologist who did not recommend a pacemaker at this time.  He is also being monitored for hyperlipidemia and diabetes. His LDL cholesterol is slightly above goal at 88 mg/dL. No significant symptoms related to his diabetes or hyperlipidemia are reported, and he is currently on aspirin therapy.  He has chronic kidney disease stage 3A, which is being monitored. He acknowledges a slight elevation in kidney function but does not report any symptoms related to this condition.  No chest pain, breathing issues, or syncope. He mentions having a mild cold but otherwise feels well. 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   Relevant histories: .  Social: still working, has his own Engineer, drilling, has two grandchildren ROS: As per HPI.   Studies Reviewed: .   Cardiac Studies & Procedures      STRESS TESTS  MYOCARDIAL PERFUSION IMAGING 05/23/2020  Narrative  Nuclear stress EF: 64%.  There was no ST segment deviation noted during stress.  No T wave inversion was noted during stress.  The study is normal.  This is a low risk study.  The left ventricular ejection fraction is normal (55-65%).  Andrew Flatten, MD    MONITORS  LONG TERM MONITOR (3-14 DAYS) 07/23/2020  Narrative  Patient had a minimum heart rate of 24 bpm, maximum heart rate of 169 bpm, and average heart rate of 48 bpm.  Predominant underlying rhythm was sinus rhythm.  Eleven runs of supraventricular tachycardia occurred lasting 11 beats at longest with a max rate of 169 bpm at fastest.  Isolated PACs were rare (<1.0%), with rare couplets and triplets present.  Isolated PVCs were rare (<1.0%), with rare couplets present.  First heart AV block.  Multiple episodes of both vasotonic sinus pauses, heart rates 30 bpm (predominantly nocturnal), with pauses occurring during the waking hours (7:31 PM).  Triggered and diary events associated with sinus rhythm.  Marked sinus bradycardia with nocturnal pauses.           Physical Exam:    VS:  BP 122/68 (BP Location: Left Arm)   Pulse (!) 38   Ht 5' 10.5" (1.791 m)   Wt 195 lb (88.5 kg)   SpO2 98%   BMI 27.58 kg/m    Wt Readings from Last 3 Encounters:  05/27/23 195 lb (88.5 kg)  12/23/22 196 lb (88.9 kg)  05/26/22 196 lb (88.9 kg)    Gen:  no distress   Neck: No JVD Cardiac: No Rubs or Gallops, No murmur, Regular bradycardia, +2 radial pulses Respiratory: Clear to auscultation bilaterally, normal effort, normal  respiratory rate GI: Soft, nontender, non-distended  MS: No  edema;  moves all extremities Integument: Skin feels warm Neuro:  At time of evaluation, alert and oriented to person/place/time/situation  Psych: Normal affect, patient feels well   ASSESSMENT AND PLAN: .    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 Asymptomatic with no chest pain or dyspnea. Evaluated by electrophysiology and Dr. Lalla Brothers, who did not recommend a pacemaker. Heart rate is slowest during sleep with no symptoms of chronotropic incompetence. Discussed that most pacemakers activate only when needed. - No pacemaker at this time - Monitor heart rate and symptoms  Hyperlipidemia with Diabetes LDL slightly above goal at 88. Asymptomatic and managing cholesterol levels. Fasting lipids will be checked today. Discussed pros and cons of aspirin therapy. - Order fasting lipid panel - Continue aspirin therapy - Consider adding Zetia based on lipid panel results; given DM, and CKD and LDL goal < 70  Chronic Kidney Disease (CKD) Stage 3A CKD stage 3A with well-managed kidney function. No new symptoms reported. - Monitor kidney function  General Health Maintenance Generally well and active, managing his own business. Blood pressure is well-controlled without medication. - Continue current lifestyle and activity level - Follow up with primary care physician, Dr. Wynelle Link, for ongoing care  Follow-up - Follow up with Dr. Wynelle Link for primary care - Return to cardiology if new cardiac symptoms arise.   Patient is able to safely graduate to primary care practice - Preoperative cardiac risk stratification: As per PCP unless clinical change in cardiac symptoms - Secondary prevention strategy: -- Elevated BP; well controlled -- HLD- atorvastatin 80 mg and may add zetia; goal LDL < 70 -- Risk Factors: asymptomatic bradycardia; can see EP in the future if needed -- Medication Transition: atorvastatin as per PCP -- if Dr. Wynelle Link prefers, will transition to yearly    Andrew Lam, MD FASE [facility] Cardiologist [facility]  [facility]  [address] [place], [address] [place], [place] [address] [phone]  10:46 AM

## 2023-05-27 NOTE — Patient Instructions (Signed)
Medication Instructions:  Your physician recommends that you continue on your current medications as directed. Please refer to the Current Medication list given to you today.  *If you need a refill on your cardiac medications before your next appointment, please call your pharmacy*   Lab Work: FLP  If you have labs (blood work) drawn today and your tests are completely normal, you will receive your results only by: MyChart Message (if you have MyChart) OR A paper copy in the mail If you have any lab test that is abnormal or we need to change your treatment, we will call you to review the results.   Testing/Procedures: NONE   Follow-Up:As needed  At [facility], you and your health needs are our priority.  As part of our continuing mission to provide you with exceptional heart care, we have created designated Provider Care Teams.  These Care Teams include your primary Cardiologist (physician) and Advanced Practice Providers (APPs -  Physician Assistants and Nurse Practitioners) who all work together to provide you with the care you need, when you need it.   Provider:   Christell Constant, MD

## 2023-07-06 DIAGNOSIS — Z1331 Encounter for screening for depression: Secondary | ICD-10-CM | POA: Diagnosis not present

## 2023-07-06 DIAGNOSIS — E1122 Type 2 diabetes mellitus with diabetic chronic kidney disease: Secondary | ICD-10-CM | POA: Diagnosis not present

## 2023-07-06 DIAGNOSIS — R001 Bradycardia, unspecified: Secondary | ICD-10-CM | POA: Diagnosis not present

## 2023-07-06 DIAGNOSIS — R030 Elevated blood-pressure reading, without diagnosis of hypertension: Secondary | ICD-10-CM | POA: Diagnosis not present

## 2023-07-06 DIAGNOSIS — E1136 Type 2 diabetes mellitus with diabetic cataract: Secondary | ICD-10-CM | POA: Diagnosis not present

## 2023-07-06 DIAGNOSIS — Z0000 Encounter for general adult medical examination without abnormal findings: Secondary | ICD-10-CM | POA: Diagnosis not present

## 2023-07-06 DIAGNOSIS — H2513 Age-related nuclear cataract, bilateral: Secondary | ICD-10-CM | POA: Diagnosis not present

## 2023-07-06 DIAGNOSIS — N1831 Chronic kidney disease, stage 3a: Secondary | ICD-10-CM | POA: Diagnosis not present

## 2023-07-06 DIAGNOSIS — E7800 Pure hypercholesterolemia, unspecified: Secondary | ICD-10-CM | POA: Diagnosis not present

## 2023-07-06 DIAGNOSIS — R972 Elevated prostate specific antigen [PSA]: Secondary | ICD-10-CM | POA: Diagnosis not present

## 2023-10-30 DIAGNOSIS — T63441A Toxic effect of venom of bees, accidental (unintentional), initial encounter: Secondary | ICD-10-CM | POA: Diagnosis not present

## 2023-11-26 DIAGNOSIS — E7800 Pure hypercholesterolemia, unspecified: Secondary | ICD-10-CM | POA: Diagnosis not present

## 2023-11-26 DIAGNOSIS — E1136 Type 2 diabetes mellitus with diabetic cataract: Secondary | ICD-10-CM | POA: Diagnosis not present

## 2023-11-26 DIAGNOSIS — E1122 Type 2 diabetes mellitus with diabetic chronic kidney disease: Secondary | ICD-10-CM | POA: Diagnosis not present

## 2023-11-26 DIAGNOSIS — N1831 Chronic kidney disease, stage 3a: Secondary | ICD-10-CM | POA: Diagnosis not present

## 2023-12-27 DIAGNOSIS — N1831 Chronic kidney disease, stage 3a: Secondary | ICD-10-CM | POA: Diagnosis not present

## 2023-12-27 DIAGNOSIS — E1122 Type 2 diabetes mellitus with diabetic chronic kidney disease: Secondary | ICD-10-CM | POA: Diagnosis not present

## 2023-12-27 DIAGNOSIS — E7800 Pure hypercholesterolemia, unspecified: Secondary | ICD-10-CM | POA: Diagnosis not present

## 2023-12-27 DIAGNOSIS — E1136 Type 2 diabetes mellitus with diabetic cataract: Secondary | ICD-10-CM | POA: Diagnosis not present

## 2024-01-06 DIAGNOSIS — M62838 Other muscle spasm: Secondary | ICD-10-CM | POA: Diagnosis not present

## 2024-01-06 DIAGNOSIS — Z23 Encounter for immunization: Secondary | ICD-10-CM | POA: Diagnosis not present

## 2024-01-06 DIAGNOSIS — R001 Bradycardia, unspecified: Secondary | ICD-10-CM | POA: Diagnosis not present

## 2024-01-06 DIAGNOSIS — E7800 Pure hypercholesterolemia, unspecified: Secondary | ICD-10-CM | POA: Diagnosis not present

## 2024-01-06 DIAGNOSIS — N1831 Chronic kidney disease, stage 3a: Secondary | ICD-10-CM | POA: Diagnosis not present

## 2024-01-06 DIAGNOSIS — E1122 Type 2 diabetes mellitus with diabetic chronic kidney disease: Secondary | ICD-10-CM | POA: Diagnosis not present

## 2024-01-26 DIAGNOSIS — E1136 Type 2 diabetes mellitus with diabetic cataract: Secondary | ICD-10-CM | POA: Diagnosis not present

## 2024-01-26 DIAGNOSIS — E7800 Pure hypercholesterolemia, unspecified: Secondary | ICD-10-CM | POA: Diagnosis not present

## 2024-01-26 DIAGNOSIS — E1122 Type 2 diabetes mellitus with diabetic chronic kidney disease: Secondary | ICD-10-CM | POA: Diagnosis not present

## 2024-01-26 DIAGNOSIS — N1831 Chronic kidney disease, stage 3a: Secondary | ICD-10-CM | POA: Diagnosis not present

## 2024-02-26 DIAGNOSIS — N1831 Chronic kidney disease, stage 3a: Secondary | ICD-10-CM | POA: Diagnosis not present

## 2024-02-26 DIAGNOSIS — E1122 Type 2 diabetes mellitus with diabetic chronic kidney disease: Secondary | ICD-10-CM | POA: Diagnosis not present

## 2024-02-26 DIAGNOSIS — E1136 Type 2 diabetes mellitus with diabetic cataract: Secondary | ICD-10-CM | POA: Diagnosis not present

## 2024-02-26 DIAGNOSIS — E7800 Pure hypercholesterolemia, unspecified: Secondary | ICD-10-CM | POA: Diagnosis not present

## 2024-03-27 DIAGNOSIS — E1122 Type 2 diabetes mellitus with diabetic chronic kidney disease: Secondary | ICD-10-CM | POA: Diagnosis not present

## 2024-03-27 DIAGNOSIS — N1831 Chronic kidney disease, stage 3a: Secondary | ICD-10-CM | POA: Diagnosis not present

## 2024-03-27 DIAGNOSIS — E1136 Type 2 diabetes mellitus with diabetic cataract: Secondary | ICD-10-CM | POA: Diagnosis not present

## 2024-03-27 DIAGNOSIS — E7800 Pure hypercholesterolemia, unspecified: Secondary | ICD-10-CM | POA: Diagnosis not present
